# Patient Record
Sex: Male | Born: 1980 | Race: White | Hispanic: No | Marital: Married | Smoking: Current every day smoker
Health system: Southern US, Community
[De-identification: ages and names within clinical notes are randomized; demographics above are authoritative.]

## PROBLEM LIST (undated history)

## (undated) DIAGNOSIS — F419 Anxiety disorder, unspecified: Secondary | ICD-10-CM

## (undated) DIAGNOSIS — M5030 Other cervical disc degeneration, unspecified cervical region: Secondary | ICD-10-CM

## (undated) DIAGNOSIS — M5020 Other cervical disc displacement, unspecified cervical region: Secondary | ICD-10-CM

## (undated) HISTORY — PX: OTHER SURGICAL HISTORY: SHX169

## (undated) HISTORY — PX: APPENDECTOMY: SHX54

## (undated) HISTORY — PX: SHOULDER SURGERY: SHX246

---

## 2008-07-03 DIAGNOSIS — S42009A Fracture of unspecified part of unspecified clavicle, initial encounter for closed fracture: Secondary | ICD-10-CM | POA: Insufficient documentation

## 2008-07-27 DIAGNOSIS — R220 Localized swelling, mass and lump, head: Secondary | ICD-10-CM | POA: Insufficient documentation

## 2008-07-27 DIAGNOSIS — R221 Localized swelling, mass and lump, neck: Secondary | ICD-10-CM | POA: Insufficient documentation

## 2012-05-05 ENCOUNTER — Other Ambulatory Visit: Payer: Self-pay | Admitting: Primary Care

## 2012-05-05 DIAGNOSIS — Z0000 Encounter for general adult medical examination without abnormal findings: Secondary | ICD-10-CM

## 2012-05-06 ENCOUNTER — Encounter: Payer: Self-pay | Admitting: Primary Care

## 2012-06-20 ENCOUNTER — Encounter: Payer: Self-pay | Admitting: Neurology

## 2012-06-20 ENCOUNTER — Ambulatory Visit: Payer: Self-pay | Admitting: Neurology

## 2012-06-20 ENCOUNTER — Other Ambulatory Visit: Payer: Self-pay | Admitting: Primary Care

## 2012-06-20 VITALS — BP 128/82 | HR 76 | Ht 68.0 in | Wt 152.0 lb

## 2012-06-20 DIAGNOSIS — M25559 Pain in unspecified hip: Secondary | ICD-10-CM

## 2012-06-20 MED ORDER — NABUMETONE 750 MG PO TABS *I*
750.0000 mg | ORAL_TABLET | Freq: Two times a day (BID) | ORAL | Status: DC
Start: 2012-06-20 — End: 2012-12-15

## 2012-06-20 MED ORDER — HYDROCODONE-ACETAMINOPHEN 5-325 MG PO TABS *I*
ORAL_TABLET | ORAL | Status: DC
Start: 2012-06-20 — End: 2012-12-15

## 2012-06-21 ENCOUNTER — Telehealth: Payer: Self-pay | Admitting: Primary Care

## 2012-06-21 NOTE — Telephone Encounter (Signed)
Please call - hip xray showed no acute concern - very mild arthritis.  Has pain improved?  If not, orthopedic consult.

## 2012-06-21 NOTE — Telephone Encounter (Signed)
Left v/m for pt- will get him in tomorrow w/urgent care

## 2012-06-21 NOTE — Telephone Encounter (Signed)
Pt states is still in pain 7-8/10. Agrees to ortho ref

## 2012-06-21 NOTE — Telephone Encounter (Signed)
Any way we could get into ortho today or tomorrow?

## 2012-06-21 NOTE — Telephone Encounter (Signed)
Looking for xray results

## 2012-06-22 NOTE — Telephone Encounter (Signed)
L/m for pt to call me back Re: best time for ortho appt

## 2012-06-22 NOTE — Telephone Encounter (Signed)
Message left earlier in AM

## 2012-06-23 ENCOUNTER — Encounter: Payer: Self-pay | Admitting: Orthopedic Surgery

## 2012-06-23 ENCOUNTER — Ambulatory Visit: Payer: Self-pay | Admitting: Orthopedic Surgery

## 2012-06-23 VITALS — BP 132/101 | HR 112 | Ht 68.0 in | Wt 150.0 lb

## 2012-06-23 DIAGNOSIS — M25559 Pain in unspecified hip: Secondary | ICD-10-CM

## 2012-06-23 NOTE — Progress Notes (Signed)
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 He states he did not have a specific injury but did move his residents recently, a lot of lifting and carrying, he states he skin to work all day and upon further questioning he does frequently stand with his left hip thrust out. At any rate 1 week ago he began having pain and points directly along the area of the gluteus medius/minimus/tensor fossa lata muscle. He has pain with prolonged standing, with range of motion. No paresthesias or radicular symptoms.    PAST MEDICAL HISTORY: Clavicle fracture    PAST SURGICAL HISTORY: Clavicle reconstruction    MEDICATIONS:  See Medication List    ALLERGIES:  See Allergy List    PERSONAL HISTORY: Married    SOCIAL HISTORY: One half pack per day smoker, does drink alcohol    FAMILY HISTORY: Negative    REVIEW OF SYSTEMS:  Review of Gastointestinal, Genitourinary, Neurologic, Integument, Vascular, Hematologic, Lymphatic, Cardiac, Pulmonary and Endocrine systems reveal the following: The review of systems has been reviewed and is available on the scanned intake form. Positive responses are noted if pertinent.    PHYSICAL EXAM: Left hip shows no gross swelling or ecchymosis, he is tenderness along the gluteus medius and minimus muscle and particularly at the posterior attachment on the greater trochanter. Skin is intact, neurovascularly intact, full range of motion. He has localized pain with hip abduction against resistance.    IMAGING: X-rays are normal    ASSESSMENT AND DIAGNOSIS: Gluteus medius/minimus strain.    PLAN: Patient just recently set an anti-inflammatory which seems to be providing early benefit. He was given some stretching exercises, he is given referral to physical therapy in case she has ongoing symptoms, followup will be in 3 weeks for repeat physical exam.    ORDERS TODAY:    ORDERS PLACED FOR  NEXT VISIT:    PERCENT OF TEMPORARY IMPAIRMENT:      The above document was generated using voice recognition software. Reasonable attempts at correction were made. Please excuse any unintended transcription errors.

## 2012-06-23 NOTE — Telephone Encounter (Signed)
Pt scheduled 06/23/12

## 2012-07-04 NOTE — Progress Notes (Signed)
S:  1 week hx of left lateral hip pain.  Radiates from buttock to groin.  Worse with ROM and weight bearing.  Ambulating with limp.  Denies precipating injury, although recently relocated - has been lifting and carrying boxes.  Denies back pain, LE pain/paresethesia/weakness.  Has been taking Ibuprofen 600 mg several times daily.  Has not applied heat or ice.      Medication regime reconciled.  Tobacco use (+).    O:  Blood pressure 128/82, pulse 76, height 1.727 m (5\' 8" ), weight 68.947 kg (152 lb).  Appears uncomfortable - ambulating with limp.  Left buttock and hip unremarkable for ecchymosis, erythema, rash.  Exquisite tenderness over left buttock and greater trochanter.  ROM limited due to pain - most pronounced with abduction, external and internal rotation.  Neurovascularly intact.    Imp/Plan:  Left hip pain - rule out trochanter bursitis.  Nabumetone 750 mg bid with food.  Norco 5/325 as directed for acute pain.  Heat and topical analgesics.  Consider orthopedic consult pending symptom response.

## 2012-07-12 ENCOUNTER — Ambulatory Visit: Payer: Self-pay | Admitting: Orthopedic Surgery

## 2012-08-15 ENCOUNTER — Other Ambulatory Visit: Payer: Self-pay | Admitting: Primary Care

## 2012-08-15 DIAGNOSIS — Z0000 Encounter for general adult medical examination without abnormal findings: Secondary | ICD-10-CM

## 2012-08-16 ENCOUNTER — Encounter: Payer: Self-pay | Admitting: Primary Care

## 2012-12-14 ENCOUNTER — Other Ambulatory Visit: Payer: Self-pay | Admitting: Primary Care

## 2012-12-14 DIAGNOSIS — Z0000 Encounter for general adult medical examination without abnormal findings: Secondary | ICD-10-CM

## 2012-12-15 ENCOUNTER — Ambulatory Visit: Payer: Self-pay | Admitting: Primary Care

## 2012-12-15 ENCOUNTER — Encounter: Payer: Self-pay | Admitting: Primary Care

## 2012-12-15 VITALS — BP 120/82 | HR 64 | Ht 68.5 in | Wt 148.0 lb

## 2012-12-15 DIAGNOSIS — E7800 Pure hypercholesterolemia, unspecified: Secondary | ICD-10-CM

## 2012-12-15 DIAGNOSIS — Z0000 Encounter for general adult medical examination without abnormal findings: Secondary | ICD-10-CM

## 2012-12-15 DIAGNOSIS — Z23 Encounter for immunization: Secondary | ICD-10-CM

## 2012-12-15 NOTE — H&P (Signed)
History and Physical    HISTORY:  Chief Complaint   Patient presents with   . Annual Exam         History of Present Illness:    HPI Comments: Expecting baby-whooping cough vacc, chol, diet ok, allergies mild, hip issues-djd, bp ok      Problems:  Patient Active Problem List   Diagnosis Code   . Closed Fracture Of The Clavicle 810.00   . Head External Swelling (___ Cm) 784.2        Past Medical/Surgical History:   No past medical history on file.  No past surgical history on file.      Allergies:    Allergies   Allergen Reactions   . No Known Drug Allergy      Created by Conversion - 0;        Current medications:    Current Outpatient Prescriptions   Medication Sig   . loratadine (CLARITIN) 10 MG tablet Take 10 mg by mouth daily as needed for Allergies   . nabumetone (RELAFEN) 750 MG tablet Take 1 tablet (750 mg total) by mouth 2 times daily   . HYDROcodone-acetaminophen (NORCO) 5-325 MG per tablet Take 1-2 tablets q6hrs prn acute pain.  mdd 8/day.  5 day emergent cover.       Family History:    Family History   Problem Relation Age of Onset   . Conversion Other      20100511^Adenocarcinoma Of The Lung^162.9^Active^   . Conversion Other      16109604^VWUJWJXB Mellitus^250.00^Active^       Social/Occupational History:   History     Social History   . Marital Status: Married     Spouse Name: N/A     Number of Children: N/A   . Years of Education: N/A     Social History Main Topics   . Smoking status: Current Every Day Smoker -- 0.50 packs/day   . Smokeless tobacco: None   . Alcohol Use: None   . Drug Use: None   . Sexual Activity: None     Other Topics Concern   . None     Social History Narrative   . None         Review of Systems:    Review of Systems   Constitutional: Negative.    HENT: Negative.    Eyes: Negative.    Respiratory: Negative.    Cardiovascular: Negative.    Gastrointestinal: Negative.    Genitourinary: Negative.    Musculoskeletal: Negative.    Skin: Negative.    Neurological: Negative.     Endo/Heme/Allergies: Negative.        Vital Signs:   BP 120/82  Pulse 64  Ht 1.74 m (5' 8.5")  Wt 67.132 kg (148 lb)  BMI 22.17 kg/m2      PHYSICAL EXAM:  Physical Exam   Vitals reviewed.  Constitutional: He is oriented to person, place, and time. No distress.   HENT:   Mouth/Throat: No oropharyngeal exudate.   Eyes: No scleral icterus.   Cardiovascular: Normal rate and regular rhythm.    Pulmonary/Chest: Effort normal and breath sounds normal.   Abdominal: Soft. Bowel sounds are normal.   Musculoskeletal: He exhibits tenderness. He exhibits no edema.   Lymphadenopathy:     He has no cervical adenopathy.   Neurological: He is alert and oriented to person, place, and time. No cranial nerve deficit.   Skin: Skin is warm and dry.  Assessment:    Carlos Dixon was seen today for annual exam.    Diagnoses and associated orders for this visit:    Annual physical exam  - EKG 12 lead         .      Plan:      1.  Allergies: Continue OTC  2.  Cholesterol: Check baseline profile  3.  Hip pain: Likely soft tissue injury versus early DJD, OTC NSAIDs as needed  4.  Health maintenance: Updated

## 2014-02-06 ENCOUNTER — Ambulatory Visit: Payer: Self-pay | Admitting: Physical Medicine and Rehabilitation

## 2014-02-27 ENCOUNTER — Telehealth: Payer: Self-pay | Admitting: Primary Care

## 2014-02-27 NOTE — Telephone Encounter (Signed)
Pt's wife called has a painful cyst on his neck- size of a nickel.  Pt would like Thursday afternoon around 3 if possible

## 2014-03-01 ENCOUNTER — Ambulatory Visit: Payer: Self-pay | Admitting: Primary Care

## 2014-03-01 ENCOUNTER — Encounter: Payer: Self-pay | Admitting: Primary Care

## 2014-03-01 VITALS — BP 108/72 | HR 64 | Wt 147.8 lb

## 2014-03-01 DIAGNOSIS — M542 Cervicalgia: Secondary | ICD-10-CM

## 2014-03-01 DIAGNOSIS — L723 Sebaceous cyst: Secondary | ICD-10-CM

## 2014-03-01 DIAGNOSIS — L089 Local infection of the skin and subcutaneous tissue, unspecified: Secondary | ICD-10-CM

## 2014-03-01 MED ORDER — SULFAMETHOXAZOLE-TRIMETHOPRIM 800-160 MG PO TABS *I*
1.0000 | ORAL_TABLET | Freq: Two times a day (BID) | ORAL | Status: AC
Start: 2014-03-01 — End: 2014-03-08

## 2014-03-01 NOTE — Progress Notes (Signed)
Subjective:     Patient ID: Carlos Dixon is a 34 y.o. male.    HPI Comments: seb cyst on neck , no fever previous h/o problem      Carlos Dixon has Closed Fracture Of The Clavicle and Head External Swelling (___ Cm) on his problem list.  Carlos Dixon has a current medication list which includes the following prescription(s): loratadine.    Review of Systems          Objective:   Physical Exam   Constitutional: No distress.   Cardiovascular: Normal rate and regular rhythm.    Pulmonary/Chest: Effort normal and breath sounds normal.   Lymphadenopathy:     He has no cervical adenopathy.   Skin: Skin is warm and dry.   infecrted seb cyst post neck    Vitals reviewed.            Assessment:      1.  Sebaceous cyst: Infected, trial of antibiotics, if fails to resolve referral to dermatology for removal, see med sheet for details  2.  Allergies: Continue loratadine          Plan:      Followup as needed

## 2014-09-24 ENCOUNTER — Ambulatory Visit: Payer: Self-pay | Admitting: Primary Care

## 2014-10-05 ENCOUNTER — Other Ambulatory Visit: Payer: Self-pay

## 2014-10-05 ENCOUNTER — Encounter: Payer: Self-pay | Admitting: Gastroenterology

## 2014-10-05 LAB — COMPREHENSIVE METABOLIC PANEL
ALT: 24 U/L (ref 10–49)
AST: 24 U/L (ref 7–37)
Albumin: 4.9 g/dL — ABNORMAL HIGH (ref 3.2–4.8)
Alk Phos: 51 U/L (ref 46–116)
Anion Gap: 5 mEq/L (ref 4–16)
Bilirubin,Total: 0.8 mg/dL (ref 0.3–1.2)
CO2: 29 mEq/L (ref 20–31)
Calcium: 10.5 mg/dL — ABNORMAL HIGH (ref 8.5–10.4)
Chloride: 105 mEq/L (ref 98–108)
Creatinine: 0.8 mg/dL (ref 0.7–1.2)
Globulin: 2.4 g/dL (ref 2.4–4.3)
Glucose: 109 mg/dL — ABNORMAL HIGH (ref 65–100)
Lab: 25 mg/dL — ABNORMAL HIGH (ref 8–20)
Potassium: 5 mEq/L (ref 3.5–5.1)
Sodium: 139 mEq/L (ref 135–145)
Total Protein: 7.3 g/dL (ref 6.4–8.5)

## 2014-10-05 LAB — CBC AND DIFFERENTIAL
Baso # K/uL: 0 10*3/uL (ref 0.0–0.2)
Basophil %: 0 % (ref 0–3)
Eos # K/uL: 0 10*3/uL (ref 0.0–0.6)
Eosinophil %: 0 % (ref 0–5)
Hematocrit: 45 % (ref 40–52)
Hemoglobin: 15.8 g/dL (ref 13.0–18.0)
Lymph # K/uL: 1.4 10*3/uL (ref 1.0–4.8)
Lymphocyte %: 6 % — ABNORMAL LOW (ref 15–45)
MCH: 30.5 pg (ref 26.0–34.0)
MCHC: 34.9 g/dL (ref 32.0–37.5)
MCV: 88 fL (ref 80–100)
Mono # K/uL: 1.5 10*3/uL — ABNORMAL HIGH (ref 0.1–1.0)
Monocyte %: 6 % (ref 0–15)
Neut # K/uL: 21.6 10*3/uL — ABNORMAL HIGH (ref 1.8–8.0)
Platelets: 196 10*3/uL (ref 150–450)
RBC: 5.18 10*6/uL (ref 4.40–6.20)
RDW: 12.9 % (ref 0.0–15.2)
Seg Neut %: 88 % — ABNORMAL HIGH (ref 45–75)
WBC: 24.6 10*3/uL — ABNORMAL HIGH (ref 4.0–11.0)

## 2014-10-05 LAB — PROTIME-INR
INR: 1 (ref 0.8–1.1)
Protime: 10.3 s (ref 9.0–12.0)

## 2014-10-05 LAB — URINALYSIS REFLEX TO CULTURE
Blood,UA: NEGATIVE
Glucose, Ur: NEGATIVE mg/dL
Ketones, UA: 40 mg/dL — AB
Leuk Esterase,UA: NEGATIVE
Nitrite,UA: NEGATIVE
PH,Ur: 6 (ref 5.0–8.0)
Protein,UA: 30 mg/dL — AB
Specific Gravity,UA: >1.035 — AB (ref 1.005–1.030)

## 2014-10-05 LAB — LIPASE: Lipase: 19 U/L (ref 6–51)

## 2014-10-05 LAB — ESTIMATED GFR
GFR,Black: >59 mL/min
GFR,Caucasian: >59 mL/min

## 2014-10-05 LAB — DIFF MANUAL

## 2014-10-05 LAB — APTT: aPTT: 24.9 s (ref 22.0–34.0)

## 2014-10-12 LAB — SURGICAL PATHOLOGY

## 2015-07-04 ENCOUNTER — Ambulatory Visit: Payer: Self-pay | Admitting: Primary Care

## 2015-07-08 ENCOUNTER — Ambulatory Visit: Payer: Self-pay | Admitting: Neurology

## 2015-07-30 ENCOUNTER — Telehealth: Payer: Self-pay | Admitting: Otolaryngology

## 2015-07-30 ENCOUNTER — Ambulatory Visit: Payer: Self-pay | Admitting: Primary Care

## 2015-07-30 ENCOUNTER — Encounter: Payer: Self-pay | Admitting: Primary Care

## 2015-07-30 VITALS — BP 122/70 | HR 83 | Wt 155.6 lb

## 2015-07-30 DIAGNOSIS — IMO0002 Reserved for concepts with insufficient information to code with codable children: Secondary | ICD-10-CM

## 2015-07-30 DIAGNOSIS — Z9049 Acquired absence of other specified parts of digestive tract: Secondary | ICD-10-CM

## 2015-07-30 NOTE — Progress Notes (Signed)
Subjective:     Patient ID: Carlos Dixon is a 35 y.o. male.    HPI Comments: New lump in post neck non tender no fever no redness,       Louise has Closed Fracture Of The Clavicle and Head External Swelling (___ Cm) on his problem list.  Madelaine Bhatdam has a current medication list which includes the following prescription(s): loratadine.    Review of Systems        Objective:   Physical Exam   Skin: Skin is warm and dry. No rash noted.   1 cm mobile non tender cyst of neck   Vitals reviewed.          Assessment:      1.  Probable sebaceous cyst of neck: not a lymph node, referral for removal, call if symptoms change or worsen  2.  Allergies: Continue OTC       Plan:      Follow-up as needed

## 2015-07-30 NOTE — Telephone Encounter (Signed)
Dr [place]'s office called. Patient was seen by Dr Roanna Raideroerr in 2010 for a right sided neck mass. He saw Dr Aron BabaWilmot today because the mass reappeared. Dr Aron BabaWilmot diagnosed it as a sebaceous cyst. He is wondering if Dr Roanna Raideroerr would be willing to see the patient again or if he should refer him to derm. Please advise

## 2015-07-31 NOTE — Telephone Encounter (Signed)
Contacted Herbert SetaHeather (wife) [phone]. I offered an appointment on offered 6/9@7 :45am w/ Dr. Roanna Raideroerr. She stated that will not work for them. She will check with her husband and will call us back to schedule.

## 2015-07-31 NOTE — Telephone Encounter (Signed)
Tried calling patient to schedule, no answer. Unable to leave a message-voicemail box full.

## 2015-11-26 ENCOUNTER — Ambulatory Visit: Payer: Self-pay | Admitting: Orthopedic Surgery

## 2015-11-26 ENCOUNTER — Encounter: Payer: Self-pay | Admitting: Orthopedic Surgery

## 2015-11-26 VITALS — BP 135/90 | HR 84 | Ht 69.0 in | Wt 150.0 lb

## 2015-11-26 DIAGNOSIS — M7541 Impingement syndrome of right shoulder: Secondary | ICD-10-CM | POA: Insufficient documentation

## 2015-11-26 DIAGNOSIS — G2589 Other specified extrapyramidal and movement disorders: Secondary | ICD-10-CM

## 2015-11-26 DIAGNOSIS — M25511 Pain in right shoulder: Secondary | ICD-10-CM

## 2015-11-26 DIAGNOSIS — M7521 Bicipital tendinitis, right shoulder: Secondary | ICD-10-CM

## 2015-11-26 HISTORY — DX: Other specified extrapyramidal and movement disorders: G25.89

## 2015-11-26 HISTORY — DX: Bicipital tendinitis, right shoulder: M75.21

## 2015-11-26 NOTE — Progress Notes (Signed)
History:  Carlos Dixon is a 35 y.o. that is being seen as a consult request from Dr. Aron Baba, Francis Gaines, MD for evaluation of his Right shoulder pain.  He does not remember any particular injury.  The symptoms first occured on 1.5 to 2 years.  The location of the pain is lateral.  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 Pulling is an aggravating motion.  Alleviating factors include:  Rest.    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 He has difficulty with reaching into the bag. He feels it every day but rest does help. No injections. No formal physical therapy. Never seen for the right shoulder. No previous trauma to this shoulder or dislocation. He does feel quite limited at work because of this right shoulder pain.    Past medical history, past surgical history, medications, allergies, family history, social history, and review of systems were reviewed today and have been documented separately in this encounter.      Past Medical History:   Diagnosis Date    Scapular dyskinesis 11/26/2015    Tendonitis of upper biceps tendon of right shoulder 11/26/2015    Right hip arthritis, previous fracture left clavicle    History reviewed. No pertinent surgical history.left clavicle ORIF, appendectomy    Social History     Social History    Marital status: Married     Spouse name: N/A    Number of children: N/A    Years of education: N/A     Occupational History    Not on file.     Social History Main Topics    Smoking status: Current Every Day Smoker     Packs/day: 0.50    Smokeless tobacco: Not on file    Alcohol use Not on file    Drug use: Not on file    Sexual activity: Not on file     Social History Narrative     Works as a mail delivery    Family History   Problem Relation Age of Onset    Conversion Other      20100511^Adenocarcinoma Of The Lung^162.9^Active^    Conversion Other      16109604^VWUJWJXB Mellitus^250.00^Active^           Current Outpatient Prescriptions:     cholecalciferol (VITAMIN D) 1000 UNIT tablet, Take 1,000 Units by mouth daily, Disp: , Rfl:     Misc Natural Products (GLUCOSAMINE CHOND COMPLEX/MSM PO), Take by mouth, Disp: , Rfl:     vitamins A-D-E-K (AQUADEKS) chewable tablet, Place 1 tablet into mouth, chew and swallow daily, Disp: , Rfl:     ibuprofen (ADVIL) 200 MG tablet, Take 200 mg by mouth every 6 hours as needed for Pain, Disp: , Rfl:     loratadine (CLARITIN) 10 MG tablet, Take 10 mg by mouth daily as needed for Allergies, Disp: , Rfl:     Allergies   Allergen Reactions    No Known Drug Allergy      Created by Conversion - 0;        I have reviewed, confirmed, and made changes as appropriate.  Details include:    Review of Systems      Constitutional    [-] Fever / Chills    Skin    [-] Rash    HENT    [-] Headaches    Cardiovascular    [-] Chest pain    Gastrointestinal    [-]  Nausea / Vomiting    Musculoskeletal    [+] Myalgias / Joint pain    Neurological    [-] Tingling    Edited byScotty Court:    Tamorah Hada Tue Nov 26, 2015  9:36 AM         Physical Examination:    He is in no acute distress.  He is alert and oriented x 3.   Examination of his neck demonstrates normal cervical range of motion with nomidline and no paraspinal tenderness.    The right shoulder demonstrates forward elevation to 160 degrees. He externally rotates to 45 degrees. He internally rotates to Lumbar. He has grade 5/5 supraspinatus strength with pain.  He has grade 5/5 infraspinatus strength and grade 5/5 subscapularis strength w pain.  Hawkins test was positive.  Neer test was positive.  POSITIVE SPEED's. O'Brian test was positive.  Crank test was negative.  Apprehension test was negative.  Relocation test was negative.  There is not tenderness at the [facility] joint. There is tenderness along the biceps. Distally he is neurovascularly intact. There is scapular dyskinesia.  There is scapular winging--dynamic.    Imaging: I personally  reviewed the patients images. X-Rays demonstrate AP, grashey, axillary and scapular y 11/26/15 views of RIGHT shoulder without fracture or lesion. Adequate joint space. No osteophytic changes. Located glenohumeral joint. Some mild arthrosis of the AC joint.           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  The patient would like to proceed with a course of physical therapy and NSAIDS.  We will see them back in in 6 weeks to evaluate.    The patient presents to clinic with shoulder pain, dysfunction, and weakness--consistent with scapular dykinesis, impingement syndrome, possible partial rotator cuff fraying, and proximal biceps tendonitis. The patient had x-rays taken today in clinic which DID NOT demonstrate significant arthritis and NO joint mal-alignment when I personally reviewed and interpreted the films. The patient has had symptoms for a duration of 12-18 months.    We discussed options regarding treatment for the shoulder. There is a wide range of treatment options:    Conservative management consists of physical therapy and/or injection therapy, typically with cortisone. There is also activity modification and living with the current pain and dysfunction. The patient was offered cortisone today but did decline, we can consider this treatment in the future if he would like, after he starts PT and uses NSAIDs.    We can also obtain an MRI in order to better evaluate the soft tissues, joint space, rotator cuff, sub-acromial space, and other shoulder structures. We can review these MRI findings either over the phone or the patient can come in for an MRI review clinic appointment where we go over the images  together. The patient will obtain an MRI if he has changes to his symptoms, worsening, or does not improve with initial conservative treatment.    We do not feel that he will be a surgical candidate at this time but we did discuss some general aspects of arthroscopic shoulder surgery including:  Arthroscopic shoulder surgery is an option to address some forms of shoulder pain. Through small poke holes/key-holes' or portals, we can diagnose pathology within the shoulder joint utilizing a special high-definition camera. We can then treat many of the conditions in the shoulder with specialized instruments, such as shavers and radio-frequency devices, to address inflamed tissue, frayed tissue, degenerated tissue, cartilage flaps, inflamed bursa, bone spurs, and we can additionally repair torn tendons and torn labrum. Typically, if the rotator cuff (specifically the supraspinatus or subscapularis) is torn, the long head of the biceps tendon is additionally torn, frayed, inflamed, and associated with a labral tear--known as a SLAP tear. We can address this torn biceps tendon, after careful intraoperative assessment, with a tenodesis, where we make a small open incision, remove the diseased part of the tendon, and reattach the tendon to bone.    Follow up in 6 weeks for repeat clinical evaluation, possible injection, possible MRI at that time.    Orders Placed This Encounter   Procedures    *Shoulder RIGHT standard AP, Grashey, and Lateral views    AMB REFERRAL TO PHYSICAL/ OCCUPATIONAL THERAPY     Scotty Court, MD  11/26/15

## 2015-11-26 NOTE — Patient Instructions (Signed)
Bursitis    The Basics   Written by the doctors and editors at UpToDate   What is bursitis?--Bursitis is a condition that can cause pain or swelling next to a joint. Most of the time, bursitis happens around the shoulder, elbow, hip, or knee. It can also happen around other joints in the body.  A bursa is a small fluid-filled sac that sits near a bone. It cushions and protects nearby tissues when they rub on or slide over bones. These sacs, called bursae, are found in many places throughout the body (figure 1 and figure 2). Bursitis happens when a bursa gets irritated and swollen. This can happen when a person:  ?Moves a joint over and over again in the same way, over a short period of time  ?Sits on a hard surface or stays in a position that presses on the bursa for a long time  ?Has certain kinds of arthritis, such as gout or rheumatoid arthritis, that can affect their joints and bursae  ?Gets hurt near a bursa  ?Has an infection that spreads to a bursa  What are the symptoms of bursitis?--Symptoms of bursitis can include:  ?Pain or tenderness  ?Swelling  ?Trouble moving the joint  A bursa can get infected if a person gets a cut on the skin nearby. An infected bursa can cause a fever and the area around the bursa to be:  ?Red  ?Swollen  ?Warm  ?Painful  If you have any of the symptoms of an infected bursa, let your doctor or nurse know as soon as possible.  Is there a test for bursitis?--Yes. Your doctor or nurse will ask about your symptoms and do an exam.   If you have symptoms of an infected bursa, your doctor might use a needle to remove some fluid from the bursa. Then he or she can do lab tests on the fluid to find out what is causing the bursitis, and if you need antibiotics.  He or she might also order imaging tests, such as an MRI scan or ultrasound. Imaging tests can create pictures of the inside of the body.  What can I do to treat my bursitis?--To treat your bursitis, you  can:  ?Rest, cushion, and protect the area - Try not to irritate the area that hurts. For example, people with very painful shoulder bursitis might need to avoid lifting or carrying heavy things for a while. They might also need to wear an arm sling. People with bursitis behind the heel might need to use a thick heel pad. This can raise the heel so that it does not rub against the back of the shoe.  ?Avoid positions that put pressure on the area - For example, people with bursitis in the front of the knee should avoid kneeling.  ?Put ice on the area to reduce pain - Use a frozen bag of peas or a cold gel pack a few times a day for 20 minutes each time.  ?Put heat on the area to reduce pain and stiffness - Do not use heat for more than 20 minutes at a time. Also, do not use anything too hot that could burn your skin.  What other treatments might I have?--Your doctor or nurse might use other treatments, depending on your symptoms and where your bursitis is. Treatments can include:  ?Pain-relieving medicines called nonsteroidal anti-inflammatory drugs or NSAIDs - NSAIDs include ibuprofen (sample brand names: Advil, Motrin), and naproxen (sample brand name: Aleve). These medicines can  reduce pain and prevent the bursae from getting swollen and painful.  ?Steroid injections - Steroid medicines help reduce inflammation. These medicines are different from the steroids athletes use to build muscle. Doctors can inject steroids into the area of the bursitis to help reduce symptoms.   ?Exercises and stretches - Your doctor or nurse might recommend that you work with a physical therapist. A physical therapist can teach you stretches and exercises to help reduce your symptoms.  ?Surgery - A doctor can do surgery if other treatments do not work and you have had symptoms for a long time.  People with an infected bursa might also have treatment that includes:  ?Antibiotics  ?Having the fluid in the bursa drained - A doctor  can drain the fluid using a needle and syringe, or by doing surgery.  Can bursitis be prevented?--Yes. To help reduce the chance that you get bursitis, you can:  ?Use cushions or pads to avoid putting too much pressure on joints - For example, people who garden can kneel on a kneeling pad. People who sit for a long time can sit on a cushioned chair.  ?Take breaks, if you are using a certain joint too much  ?Stop an activity or change the way you are doing it, if you feel pain  ?Exercise  ?Lose weight, if you are overweight   ?Use good posture  All topics are updated as new evidence becomes available and our peer review process is complete.  This topic retrieved from Up[employer] on: Jun 14, 2013.  Topic 15727 Version 5.0  Release: 23.3 - C23.74  2015[employer], Inc.All rights reserved.  figure 1: Bursae near the hip     These sacs, called "bursae," are filled with fluid. They help cushion and protect the joints. "Bursitis" is the medical term for when one of these sacs gets irritated or inflamed.  Graphic [id number] Version 7.0  figure 2: Knee bursa (prepatellar bursa)     Graphic [id number] Version 3.0  Consumer Information Use and Disclaimer   This information is not specific medical advice and does not replace information you receive from your health care provider. This is only a brief summary of general information. It does NOT include all information about conditions, illnesses, injuries, tests, procedures, treatments, therapies, discharge instructions or life-style choices that may apply to you. You must talk with your health care provider for complete information about your health and treatment options. This information should not be used to decide whether or not to accept your health care provider's advice, instructions or recommendations. Only your health care provider has the knowledge and training to provide advice that is right for you.The use of UpToDate content is governed by the UpToDate Terms of Use. 2015  [employer]Date, Inc. All rights reserved.  Copyright   2015[employer]Date, Inc.All rights reserved.      REHABILITATION PROTOCOL:  SCAPULA DYSKINESIS  Scotty CourtSandeep Danya Spearman, MD, PhD  PHASE 1:  ACUTE PHASE (WEEKS 0 - 3)      Initially, avoid painful arm movement and establish scapular motion.     Begin soft tissue mobilization, electrical modalities, ultrasound, and assisted stretching.    Begin upper extremity weight shifts, wobble board exercises, scapular clock, rhythmic ball stabilization and weight-bearing isometric extension.    Use these CKC exercises in various planes and levels of elevation, but coordinate them with appropriate scapular positioning.    Initiate scapular motion exercises without arm elevation.    Include arm motion with scapular motion exercises because the scapular  motion improves to reestablish scapulohumeral coupling patterns.  Keep the arm close to the body initially to minimize the intrinsic load.    Emphasize lower abdominal and hip extensor exercises form the standing position.       PHASE 2:  RECOVERY PHASE (WEEKS 3 - 8)      Continue to emphasize lower abdominal and hip extensor exercises along with flexibility exercises for scapular stabilizers.    Increase the loads on CKC exercises such as wall push-ups, table push-ups, and modified prone push-ups.    Also, increase the level of arm elevation in CKC exercises as scapular control improves.    Add arm elevation and rotation patterns to scapular motion exercises, as able.  Use diagonal patterns, scapular plane, and flexion.  Progress toward active abduction.    Begin tubing exercises using hip and trunk extension with retraction and hip and trunk flexion with protraction.    As scapulohumeral coupling and control are achieved, dumbbell punches may be introduced.      Use lunges with dumbbell reaches to emphasize kinetic chain timing and coordination.      PHASE 3:  FUNCTIONAL PHASE (WEEKS  6 - 10)      When there is good scapular  control and motion throughout the range of shoulder elevation, initiate plyometric exercise such as medicine ball toss and catch and tubing plyometrics.    Continue to include kinetic chain activation.  Move to various planes as scapular control improves.    Slow, resisted sport-skill movements, such as the throwing motion, are good activities to promote kinetic chain stabilization while dynamically loading the scapular muscles.    Overhead dumbbell presses and punches, in various planes, are advanced exercises requiring good scapular control through a full and loaded GH joint ROM.    The lunge-and-reach series can be progressed to overhead reaches in the return position.    Progressively add external resistance to exercises introduced earlier in the program.  The volume of work becomes a progression as do the difficulty of the exercise and the amount of resistance.    Challenging lower extremity stability using wobble board, trampoline, slide boards, and the like also increases the load on the scapular musculature without sacrificing the functional movements.

## 2015-12-02 ENCOUNTER — Encounter: Payer: Self-pay | Admitting: Physical Therapy

## 2015-12-02 NOTE — Progress Notes (Signed)
A user error has taken place: encounter opened in error, closed for administrative reasons.

## 2015-12-20 ENCOUNTER — Ambulatory Visit: Payer: Self-pay | Admitting: Rehabilitative and Restorative Service Providers"

## 2015-12-20 ENCOUNTER — Ambulatory Visit: Payer: Self-pay | Admitting: Primary Care

## 2015-12-20 DIAGNOSIS — M25811 Other specified joint disorders, right shoulder: Secondary | ICD-10-CM

## 2015-12-20 DIAGNOSIS — M7541 Impingement syndrome of right shoulder: Secondary | ICD-10-CM

## 2015-12-20 NOTE — Progress Notes (Signed)
PT SPORTS REHABILITATION UE EVALUATION    History  Diagnosis: right shoulder impingement   Onset date:  1.5-2 years ago  Date of surgery: NA    Carlos Dixon is a 35 y.o. male who is present today for right, shoulder care.  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   Stresses/physical demands of home: Self Care, Housekeeping, Gardening/Yard Work and Child Care  Sport(s): NA , walking for work   Diagnostic tests: Per report, reviewed    Other: NA    Symptom location: Lateral and Anterior, right  Relevant symptoms: Aching, Pain   Symptom frequency: Intermittent  Symptom intensity (0 - 10 scale): Now 2 Best 0 Worst 10   Night Pain: Yes      Symptoms worsen with: repetitive movements, reaching behind back and overhead   Symptoms improve with: Ibuprofen   Assistive device:  none  Patients goals for therapy: Reduce pain, Independent with home program, return to work, return to exercising      Objective:    Observation: WNL  Palpation: tenderness @ joint  Cervical Screen:  WNL  Neurologic:  NA        ROM/Strength    UE      AROM AROM PROM PROM MMT MMT    Right Left Right Left Right Left   Shoulder          Forward Flexion 140 145   4- 5   Abduction  140 170   4 5   Internal Rotation 30 45   4+ 5   External Rotation 45 85   4- 5                Special Tests:    Shoulder Jobe, positive  Neer,  positive  Leanord AsalKennedy Hawkins,  positive  ER lag sign,  negative  Subscapularis lift off,  negative  Speed's,  negative  O'Brien,  positive     (+) scapular dyskinesis with repeated elevation of arm     Elbow NA   Wrist/hand NA      Functional:  Perform self-care activities/basic ADLs - able to perform.  Perform functional forward reach - able to perform with pain  Reach overhead - able to perform with pain and limited    Assessment:   Findings consistent with  35 y.o. male with right shoulder impingement  with pain    Prognosis:  Good    Contraindications/Precautions/Limitation:  Per diagnosis    Short Term Goals: (2 week(s)): Decrease c/o max pain to < 4/10 and Minimal assistance with HEP/ education concepts  Long Term Goals: (3 month(s)): Pain/Sx 0 - minimal, ROM/ flexibility WNL , Restoration of functional strength, Independent with HEP/education , Functional return to ADLs / activities without limitations     Treatment Plan:   Options / plan reviewed with patient:  Yes  Freq 1 times per week for 3 month(s)    Treatment plan inclusive of:   Exercise: AROM, AAROM, PROM, Stretching, Progressive Resistive, Aerobic exercise   Manual Techniques:  Joint mobilization   Modalities:  Cold pack, Functional/Therapeutic activites per flowsheet, Ther Exercise per flowsheet   Functional: Proprioception/Dynamic stability, Functional rehab      [facility] you [facility] the [facility] of this [facility].    Jani GravelAmanda Karlsson, PT    Wand ER at  45 and 90 abd  X 10    Sleeper stretch 10 x 10"   SL ER  X 10    Prone I  X 10    Prone T  X 10

## 2015-12-20 NOTE — Progress Notes (Signed)
SPORTS PT CHARGE:   Evaluation:  PT CODE 7162 - CPT CODE PT - 97162 - PT EVAL MOD COMPLX (x 30 min)  Therapeutic Exercises:  PT Code 314 - CPT Code PT - 97110 - Therapeutic Exercise (8-22 min) x 1 unit  Total Minute Time:  45 min

## 2015-12-30 ENCOUNTER — Ambulatory Visit: Payer: Self-pay | Admitting: Rehabilitative and Restorative Service Providers"

## 2015-12-30 DIAGNOSIS — M7541 Impingement syndrome of right shoulder: Secondary | ICD-10-CM

## 2015-12-30 DIAGNOSIS — M25811 Other specified joint disorders, right shoulder: Secondary | ICD-10-CM

## 2015-12-30 NOTE — Progress Notes (Signed)
SPORTS PT CHARGE:   Therapeutic Exercises:  PT Code 314 - CPT Code PT - 97110 - Therapeutic Exercise (38 - 52 min) x 3 units  Total Minute Time:  45 min

## 2015-12-30 NOTE — Progress Notes (Signed)
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 He reports fair compliance with HEP, has been able to complete about 4 days per week but not everyday due to time constraints.      Objective:  ROM - Improved - right shoulder   Flex = 147  Abd = 150  ER at 90 abd = 55   Strength - Ther Ex per flowsheet  Function: - Improved  Education:  Updated HEP, Verbal cues for ther ex, Manual cues for ther ex    Objective     Wand ER at 45 and 90 abd  X 10    Sleeper stretch 10 x 10"   SL ER  3 X 10    Prone I  3 X 10    Prone T  3 X 10        Tubing row  Orange 3 x 10                    Treatment:  Ther Exercise per flowsheet    Assessment:   Patient tolerated program well. Cues required to correct exercises. He does demonstrate improved ROM today.        Plan of Care:  Continue per Plan of care -  As written    Thank you [facility] this patient to [facility]    Jani GravelAmanda Karlsson, PT

## 2016-01-07 ENCOUNTER — Ambulatory Visit: Payer: Self-pay | Admitting: Rehabilitative and Restorative Service Providers"

## 2016-01-07 ENCOUNTER — Ambulatory Visit: Payer: Self-pay | Admitting: Orthopedic Surgery

## 2016-01-07 DIAGNOSIS — M7541 Impingement syndrome of right shoulder: Secondary | ICD-10-CM

## 2016-01-07 DIAGNOSIS — M25811 Other specified joint disorders, right shoulder: Secondary | ICD-10-CM

## 2016-01-07 NOTE — Progress Notes (Signed)
[facility]  PT Note    Carlos Dixon   [id number]     Diagnosis: right shoulder scapular dyskinesis, impingement     Subjective:  Pain Score:  2  Patient reports doing pretty well.   States he has not been able to do as many exercises this week due to having a newborn baby and staying at the hospital for 5 days.           Objective:  ROM - Improved - right shoulder   Flex = 147  Abd = 150  ER at 90 abd = 55   Strength - Ther Ex per flowsheet  Function: - Improved  Education:  Updated HEP, Verbal cues for ther ex, Manual cues for ther ex    Objective     Wand ER at 45 and 90 abd  X 10    Sleeper stretch 10 x 10"   SL ER  3 X 10 - 1#   Prone I  3 X 10 - 2#   Prone T  3 X 10 - 2#        Tubing row  Orange 3 x 10    Tubing ext  Orange 3 x 10                Treatment:  Ther Exercise per flowsheet    Assessment:   Patient tolerated program well.    Fatigued at end of session.         Plan of Care:  Continue per Plan of care -  As written    Thank you for [facility] this [facility] to [facility]    Jani GravelAmanda Karlsson, PT

## 2016-01-07 NOTE — Progress Notes (Signed)
SPORTS PT CHARGE:   Therapeutic Exercises:  PT Code 314 - CPT Code PT - 97110 - Therapeutic Exercise (38 - 52 min) x 3 units  Total Minute Time:  45 min

## 2016-01-15 ENCOUNTER — Ambulatory Visit: Payer: Self-pay

## 2016-01-15 DIAGNOSIS — M25811 Other specified joint disorders, right shoulder: Secondary | ICD-10-CM

## 2016-01-15 DIAGNOSIS — M7541 Impingement syndrome of right shoulder: Secondary | ICD-10-CM

## 2016-01-15 NOTE — Progress Notes (Signed)
SPORTS PT CHARGE:   Therapeutic Exercises:  PT Code 314 - CPT Code PT - 97110 - Therapeutic Exercise (38 - 52 min) x 3 units  Total Minute Time:  45 min

## 2016-01-15 NOTE — Progress Notes (Signed)
[facility]  PT Note    Carlos Dixon   [id number]     Diagnosis: right shoulder scapular dyskinesis, impingement     Subjective:  Pain Score:  2  Patient reports doing pretty well. He states he is has not had time to do his HEP as much as he would like because he has been in and out of the hospital with his newborn baby.            Objective:  ROM - Improved - right shoulder   Flex = 150  Abd = 150  ER at 90 abd = 60  Strength - Ther Ex per flowsheet  Function: - Improved  Education:  Updated HEP, Verbal cues for ther ex, Manual cues for ther ex    Objective     Wand ER at 45 and 90 abd  X 10    Sleeper stretch 10 x 10"   SL ER  3 X 10 - 1#   Prone I  3 X 10 - 2#   Prone T  3 X 10 - 2#        Tubing row  Orange 3 x 10    Tubing ext  Orange 3 x 10    Tubing IR/ER Walkouts 20x Each            Treatment:  Ther Exercise per flowsheet    Assessment:   Patient tolerated program well.    Fatigued at end of session.         Plan of Care:  Continue per Plan of care -  As written    [facility] this [facility]    Barth KirksAlison Massaro, ATC,PTA

## 2016-01-20 ENCOUNTER — Encounter: Payer: Self-pay | Admitting: Primary Care

## 2016-01-20 ENCOUNTER — Ambulatory Visit: Payer: Self-pay | Admitting: Rehabilitative and Restorative Service Providers"

## 2016-01-20 ENCOUNTER — Ambulatory Visit: Payer: Self-pay | Admitting: Primary Care

## 2016-01-20 VITALS — BP 110/84 | HR 66 | Wt 154.2 lb

## 2016-01-20 DIAGNOSIS — R454 Irritability and anger: Secondary | ICD-10-CM

## 2016-01-20 DIAGNOSIS — M7541 Impingement syndrome of right shoulder: Secondary | ICD-10-CM

## 2016-01-20 DIAGNOSIS — E7800 Pure hypercholesterolemia, unspecified: Secondary | ICD-10-CM

## 2016-01-20 DIAGNOSIS — M25811 Other specified joint disorders, right shoulder: Secondary | ICD-10-CM

## 2016-01-20 DIAGNOSIS — Z720 Tobacco use: Secondary | ICD-10-CM

## 2016-01-20 MED ORDER — VARENICLINE TARTRATE 0.5 MG X 11 & 1 MG X 42 PO MISC *A*
ORAL | 0 refills | Status: DC
Start: 2016-01-20 — End: 2017-01-19

## 2016-01-20 NOTE — Progress Notes (Signed)
Subjective:     Patient ID: Carlos Dixon is a 35 y.o. male.    HPI Comments: Tobacco  fhx lung ca, 1ppd, failed welbutrin,       Carlos Dixon has Closed Fracture Of The Clavicle; Head External Swelling (___ Cm); S/P appy; Tendonitis of upper biceps tendon of right shoulder; Impingement syndrome of shoulder region, right; and Scapular dyskinesis--right on his problem list.  Carlos Dixon has a current medication list which includes the following prescription(s): cholecalciferol, misc natural products, vitamins a-d-e-k, ibuprofen, and loratadine.    Review of Systems        Objective:   Physical Exam   Constitutional: No distress.   Cardiovascular: Normal rate and regular rhythm.    Pulmonary/Chest: Effort normal and breath sounds normal.   Musculoskeletal: He exhibits no edema.   Vitals reviewed.          Assessment:      1.  Tobacco abuse: Discussed multiple options, will try trial of chantix, consider addition of low-dose Wellbutrin if irritability worse  2.  Cholesterol: Has never been checked, recheck fasting profile, low threshold for statin given tobacco history       Plan:      Follow-up based on success or failure

## 2016-01-20 NOTE — Progress Notes (Signed)
SPORTS PT CHARGE:   Therapeutic Exercises:  PT Code 314 - CPT Code PT - 97110 - Therapeutic Exercise (38 - 52 min) x 3 units  Total Minute Time:  45 min

## 2016-01-20 NOTE — Progress Notes (Signed)
[facility]  PT Note    Carlos Dixon   [id number]     Diagnosis: right shoulder scapular dyskinesis, impingement     Subjective:  Pain Score:  2  Patient reports shoulder is feeling pretty good, has been okay since he has still been off of work since wife had their baby.  He goes back to work in one week.  He follows up with Dr. Stephenie AcresMannava tomorrow.           Objective:  ROM - Improved - right shoulder   Flex = 150  Abd = 150  ER at 90 abd = 85  IR at 90 abd = 25  Strength - Ther Ex per flowsheet  Function: - Improved  Education:  Updated HEP, Verbal cues for ther ex, Manual cues for ther ex    Objective     Wand ER at 45 and 90 abd     Sleeper stretch 10 x 10"           SL ER  3 X 10 - 1#   Prone I  3 X 10 - 2#   Prone T  3 X 10 - 2#        Tubing row  Orange 3 x 10    Tubing ext  Orange 3 x 10    Tubing IR/ER Walkouts 20x Each            Treatment:  Ther Exercise per flowsheet    Assessment:   Patient tolerated program well. He demonstrates improved ROM of shoulder and has improved scapular control.  He still fatigues by end of program.         Plan of Care:  Continue per Plan of care -  As written    [facility]    Jani GravelAmanda Karlsson, PT

## 2016-01-21 ENCOUNTER — Ambulatory Visit: Payer: Self-pay | Admitting: Orthopedic Surgery

## 2016-01-31 ENCOUNTER — Ambulatory Visit: Payer: Self-pay | Admitting: Rehabilitative and Restorative Service Providers"

## 2016-03-05 ENCOUNTER — Encounter: Payer: Self-pay | Admitting: Rehabilitative and Restorative Service Providers"

## 2016-03-05 DIAGNOSIS — M7541 Impingement syndrome of right shoulder: Secondary | ICD-10-CM

## 2016-03-05 DIAGNOSIS — M25811 Other specified joint disorders, right shoulder: Secondary | ICD-10-CM

## 2016-03-05 NOTE — Progress Notes (Signed)
Sports and Spine Rehabilitation  Discharge Summary      Carlos Dixon  [id number]  03/05/2016    DiagLane Hackernosis: right shoulder impingement, scapular dyskinesis    SUMMARY OF TREATMENTS:  Received care for 5 rehabilitation visits    Attendance:  Fair    Compliance:  Good     The treatment(s) included:  Home program instruction, Therapeutic exercise (ROM/flexibility/strength), Progressive Resistive Exercises, Functional progression, Therapeutic activities dynamic stabilization    Treatment Goals:  Unknown  Range of Motion/Flexibility:  Unknown  Strength/Motor Performance:  Unknown  Functional Recovery:  Unknown  Pain Control:  Unknown  Home Program:  inst. in HEP  Other:  NA    REASON FOR DISCHARGE:  Patient did not return for follow-up     Prognosis at time of discharge:  good  Comments:  NA    Thank you [facility] the [facility] of this [facility].    Jani GravelAmanda Karlsson, PT

## 2017-01-19 ENCOUNTER — Ambulatory Visit: Payer: BLUE CROSS/BLUE SHIELD | Attending: Neurology | Admitting: Neurology

## 2017-01-19 ENCOUNTER — Encounter: Payer: Self-pay | Admitting: Neurology

## 2017-01-19 VITALS — BP 120/80 | HR 84 | Temp 99.7°F | Wt 139.2 lb

## 2017-01-19 DIAGNOSIS — F439 Reaction to severe stress, unspecified: Secondary | ICD-10-CM | POA: Insufficient documentation

## 2017-01-19 DIAGNOSIS — Z720 Tobacco use: Secondary | ICD-10-CM | POA: Insufficient documentation

## 2017-01-19 DIAGNOSIS — R350 Frequency of micturition: Secondary | ICD-10-CM

## 2017-01-19 DIAGNOSIS — Z13220 Encounter for screening for lipoid disorders: Secondary | ICD-10-CM | POA: Insufficient documentation

## 2017-01-19 DIAGNOSIS — R5383 Other fatigue: Secondary | ICD-10-CM | POA: Insufficient documentation

## 2017-01-19 DIAGNOSIS — R300 Dysuria: Secondary | ICD-10-CM | POA: Insufficient documentation

## 2017-01-19 LAB — URINALYSIS WITH MICROSCOPIC
Blood,UA: NEGATIVE
Ketones, UA: NEGATIVE
Leuk Esterase,UA: NEGATIVE
Nitrite,UA: NEGATIVE
Protein,UA: NEGATIVE mg/dL
RBC,UA: 1 /hpf (ref 0–2)
Specific Gravity,UA: 1.023 (ref 1.002–1.030)
WBC,UA: <1 /hpf (ref 0–5)
pH,UA: 7 (ref 5.0–8.0)

## 2017-01-19 LAB — POCT URINALYSIS DIPSTICK
Glucose,UA POCT: NORMAL mg/dL
Ketones,UA POCT: NEGATIVE mg/dL
Lot #: 29981701
Nitrite,UA POCT: NEGATIVE
PH,UA POCT: 8 (ref 5–8)
Specific gravity,UA POCT: 1.015 (ref 1.002–1.030)
Urobilinogen,UA: NORMAL mg/dL

## 2017-01-20 ENCOUNTER — Other Ambulatory Visit: Payer: Self-pay | Admitting: Primary Care

## 2017-01-20 LAB — AEROBIC CULTURE: Aerobic Culture: 0

## 2017-01-20 MED ORDER — CIPROFLOXACIN HCL 500 MG PO TABS *I*
500.0000 mg | ORAL_TABLET | Freq: Two times a day (BID) | ORAL | 0 refills | Status: DC
Start: 2017-01-20 — End: 2017-02-18

## 2017-01-21 ENCOUNTER — Ambulatory Visit
Admission: RE | Admit: 2017-01-21 | Discharge: 2017-01-21 | Disposition: A | Discharge status: Home / Self Care | Payer: BLUE CROSS/BLUE SHIELD | Source: Ambulatory Visit

## 2017-01-21 DIAGNOSIS — R102 Pelvic and perineal pain: Secondary | ICD-10-CM

## 2017-01-21 DIAGNOSIS — Z720 Tobacco use: Secondary | ICD-10-CM

## 2017-01-21 DIAGNOSIS — R05 Cough: Secondary | ICD-10-CM

## 2017-01-21 DIAGNOSIS — R3129 Other microscopic hematuria: Secondary | ICD-10-CM

## 2017-01-22 ENCOUNTER — Telehealth: Payer: Self-pay | Admitting: Primary Care

## 2017-01-22 NOTE — Telephone Encounter (Signed)
Marchelle Folksmanda spoke with him with result note instructions

## 2017-01-22 NOTE — Telephone Encounter (Signed)
Pt looking for xray results(told pt no stones) but now is having back pain 5 out of 10.  Please advise

## 2017-01-24 NOTE — Progress Notes (Signed)
Subjective:     Patient ID: Carlos Dixon is a 36 y.o. male.    HPI  1 week hx of dysuria and urinary frequency.  Suprapubic pressure.  No flank pain or hematuria..  Sexually active monogamous.  Denies penile exudate.  Denies fever or chills.      Increased stress.  New job - start time 2 AM.  Endorses fatigue. Sleeping less than 5 hours/night. 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  Recently named POA for grandmother.      Tobacco use 1 PPD.  Denies SOB, wheeze, hemoptysis.    Father died of lung cancer - age 36.     Patient's medications, allergies, past medical, surgical, social and family histories were reviewed and updated as appropriate.  Patient Active Problem List   Diagnosis Code    Closed Fracture Of The Clavicle S42.009A    Head External Swelling (___ Cm) R22.0, R22.1    S/P appy Z90.49    Tendonitis of upper biceps tendon of right shoulder M75.21    Impingement syndrome of shoulder region, right M75.41    Scapular dyskinesis--right G25.89     Current Outpatient Prescriptions   Medication    cholecalciferol (VITAMIN D) 1000 UNIT tablet    Misc Natural Products (GLUCOSAMINE CHOND COMPLEX/MSM PO)    vitamins A-D-E-K (AQUADEKS) chewable tablet    ibuprofen (ADVIL) 200 MG tablet    loratadine (CLARITIN) 10 MG tablet    ciprofloxacin (CIPRO) 500 MG tablet     No current facility-administered medications for this visit.        Review of Systems   Constitutional: Positive for fatigue. Negative for chills and fever.   Gastrointestinal: Negative for abdominal pain.   Genitourinary: Positive for dysuria and frequency. Negative for discharge, hematuria and penile pain.   Psychiatric/Behavioral: Positive for sleep disturbance. The patient is nervous/anxious.            Objective:   Physical Exam  Blood pressure 120/80, pulse 84, temperature 37.6 C (99.7 F), temperature source Temporal, weight 63.1 kg (139 lb 3.2 oz).  Yassin appears comfortable.  Affect anxious.  No pallor.  Sclera anicteric.  Neck supple.   Lungs CTA.  RRR.  Normal bowel sounds - belly soft.  No suprapubic or CVA tenderness.      Specific gravity,UA POCT 1.015  1.002 - 1.030 Final   PH,UA POCT 8.0  5 - 8 Final   Leuk Esterase,UA POCT Trace   Negative Final   Nitrite,UA POCT Negative  Negative Final   Protein,UA POCT Trace   Negative mg/dL Final   Glucose,UA POCT Normal  Normal mg/dL Final   Ketones,UA POCT Negative  Negative mg/dL Final   Urobilinogen,UA Normal  Less than 1 mg/dL Final   Bilirubin,Ur +   Negative Final   Blood,UA POCT Trace   Negative Final             Assessment/Plan:      1.  Dysuria & urinary frequency.         Microhematuria.  Urinalysis and urine culture pending.   KUB pending - r/o renal calculi.   Ciprofloxacin x 5 days.  Hydration encouraged.     2.  Tobacco abuse.         Family hx of premature lung cancer.  CXR pending.  He understands the importance of smoking cessation.      3.  Fatigue.  Multifactorial.  Lab work pending, including CMP, CBC, TSH.    4.  Stress.  Encouraged him to focus on balanced nutrition, exercise and sleep hygiene.     Telephone FU

## 2017-02-18 ENCOUNTER — Ambulatory Visit: Payer: BLUE CROSS/BLUE SHIELD | Attending: Neurology | Admitting: Neurology

## 2017-02-18 ENCOUNTER — Encounter: Payer: Self-pay | Admitting: Neurology

## 2017-02-18 VITALS — BP 130/86 | HR 87 | Wt 140.4 lb

## 2017-02-18 DIAGNOSIS — F32A Depression, unspecified: Secondary | ICD-10-CM

## 2017-02-18 DIAGNOSIS — F329 Major depressive disorder, single episode, unspecified: Secondary | ICD-10-CM

## 2017-02-18 DIAGNOSIS — R5383 Other fatigue: Secondary | ICD-10-CM

## 2017-02-18 DIAGNOSIS — F419 Anxiety disorder, unspecified: Secondary | ICD-10-CM

## 2017-02-18 MED ORDER — ESCITALOPRAM OXALATE 10 MG PO TABS *I*
10.0000 mg | ORAL_TABLET | Freq: Every day | ORAL | 2 refills | Status: DC
Start: 2017-02-18 — End: 2017-05-28

## 2017-02-28 NOTE — Progress Notes (Signed)
Subjective:     Patient ID: Lane Hackerdam Broadus is a 37 y.o. male.    HPI Persistent anxiety & depression.  Feels overwhelmed.  Irritable.  Emotional.  Affecting personal and professional dynamics.  Not sleeping well.  Endorses fatigue. Is receptive to medication.  Counseling cost prohibitive.     Patient's medications, allergies, past medical, surgical, social and family histories were reviewed and updated as appropriate.  Current Outpatient Prescriptions   Medication    Multiple Vitamins-Minerals (MULTIVITAMIN ADULTS PO)    cholecalciferol (VITAMIN D) 1000 UNIT tablet    Misc Natural Products (GLUCOSAMINE CHOND COMPLEX/MSM PO)    ibuprofen (ADVIL) 200 MG tablet    loratadine (CLARITIN) 10 MG tablet    escitalopram (LEXAPRO) 10 MG tablet     No current facility-administered medications for this visit.        Review of Systems   Constitutional: Positive for fatigue.   Psychiatric/Behavioral: Positive for decreased concentration and sleep disturbance. Negative for suicidal ideas. The patient is nervous/anxious.            Objective:   Physical Exam  Blood pressure 130/86, pulse 87, weight 63.7 kg (140 lb 6.4 oz).  Appears comfortable.  Groomed appropriately.  Mentation and speech clear.  Affect appropriate.          Assessment:      Anxiety & depression.   Fatigue.        Plan:      Escitalopram 10 mg once daily.    Reinforced importance of balanced nutrition, exercise and sleep hygiene.   FU 4 weeks.

## 2017-04-01 ENCOUNTER — Ambulatory Visit: Payer: BLUE CROSS/BLUE SHIELD | Admitting: Neurology

## 2017-05-28 ENCOUNTER — Other Ambulatory Visit: Payer: Self-pay | Admitting: Primary Care

## 2017-05-28 MED ORDER — ESCITALOPRAM OXALATE 10 MG PO TABS *I*
10.0000 mg | ORAL_TABLET | Freq: Every day | ORAL | 0 refills | Status: DC
Start: 2017-05-28 — End: 2017-06-22

## 2017-06-22 ENCOUNTER — Other Ambulatory Visit: Payer: Self-pay | Admitting: Primary Care

## 2017-06-22 ENCOUNTER — Encounter: Payer: Self-pay | Admitting: Primary Care

## 2017-06-22 ENCOUNTER — Ambulatory Visit: Payer: BLUE CROSS/BLUE SHIELD | Attending: Primary Care | Admitting: Primary Care

## 2017-06-22 VITALS — BP 116/80 | HR 127 | Temp 99.5°F | Wt 146.2 lb

## 2017-06-22 DIAGNOSIS — R000 Tachycardia, unspecified: Secondary | ICD-10-CM | POA: Insufficient documentation

## 2017-06-22 DIAGNOSIS — R319 Hematuria, unspecified: Secondary | ICD-10-CM

## 2017-06-22 DIAGNOSIS — M549 Dorsalgia, unspecified: Secondary | ICD-10-CM

## 2017-06-22 DIAGNOSIS — R5383 Other fatigue: Secondary | ICD-10-CM | POA: Insufficient documentation

## 2017-06-22 DIAGNOSIS — F419 Anxiety disorder, unspecified: Secondary | ICD-10-CM | POA: Insufficient documentation

## 2017-06-22 LAB — URINALYSIS WITH MICROSCOPIC
Nitrite,UA: NEGATIVE
Protein,UA: 30 mg/dL — AB
RBC,UA: 1 /hpf (ref 0–2)
Specific Gravity,UA: 1.033 — ABNORMAL HIGH (ref 1.002–1.030)
WBC,UA: 1 /hpf (ref 0–5)
pH,UA: 5 (ref 5.0–8.0)

## 2017-06-22 LAB — POCT URINALYSIS DIPSTICK
Bilirubin,Ur: NEGATIVE
Glucose,UA POCT: NORMAL mg/dL
Ketones,UA POCT: NEGATIVE mg/dL
Lot #: 32222103
Nitrite,UA POCT: NEGATIVE
PH,UA POCT: 5 (ref 5–8)
Specific gravity,UA POCT: 1.02 (ref 1.002–1.030)
Urobilinogen,UA: NORMAL mg/dL

## 2017-06-22 MED ORDER — ESCITALOPRAM OXALATE 20 MG PO TABS *I*
20.0000 mg | ORAL_TABLET | Freq: Every day | ORAL | 5 refills | Status: DC
Start: 2017-06-22 — End: 2018-01-10

## 2017-06-22 NOTE — Progress Notes (Signed)
Subjective:     Patient ID: Carlos Dixon is a 37 y.o. male.    Hematuria  Gross this am  Mild back pain, no fever no dysuria, + tobacco  Inc anxiety        Ruffus has S/P appy and Impingement syndrome of shoulder region, right on his problem list.  Jeyden has a current medication list which includes the following prescription(s): escitalopram, multiple vitamins-minerals, cholecalciferol, misc natural products, ibuprofen, and loratadine.    Review of Systems        Objective:   Physical Exam   Cardiovascular: Normal rate and regular rhythm.    Pulmonary/Chest: Effort normal and breath sounds normal.   Abdominal: Soft. Bowel sounds are normal.   No CVAT   Vitals reviewed.          Assessment:      1.  Hematuria: Referral for CT urogram, urine culture, likely urology referral if workup negative, further plan based on results  2.  Tobacco: Discussed quitting  3.  Anxiety: Multiple life events, increase SSRI  4.  Tachycardia: Likely secondary to anxiety, check blood work       Plan:      Follow-up based on results

## 2017-06-23 ENCOUNTER — Ambulatory Visit: Payer: BLUE CROSS/BLUE SHIELD

## 2017-06-23 LAB — AEROBIC CULTURE: Aerobic Culture: 0

## 2017-06-24 ENCOUNTER — Ambulatory Visit
Admission: RE | Admit: 2017-06-24 | Discharge: 2017-06-24 | Disposition: A | Discharge status: Home / Self Care | Payer: BLUE CROSS/BLUE SHIELD | Source: Ambulatory Visit

## 2017-06-24 DIAGNOSIS — R319 Hematuria, unspecified: Secondary | ICD-10-CM

## 2017-06-24 MED ORDER — IOHEXOL 350 MG/ML (OMNIPAQUE) IV SOLN *I*
1.0000 mL | Freq: Once | INTRAVENOUS | Status: AC
Start: 2017-06-24 — End: 2017-06-24
  Administered 2017-06-24: 100 mL via INTRAVENOUS

## 2017-06-25 ENCOUNTER — Encounter: Payer: Self-pay | Admitting: Urology

## 2017-06-25 ENCOUNTER — Telehealth: Payer: Self-pay | Admitting: Urology

## 2017-06-25 NOTE — Telephone Encounter (Signed)
A user error has taken place: encounter opened in error, closed for administrative reasons.

## 2017-06-25 NOTE — Telephone Encounter (Addendum)
Tresa Endo from Dr. Tanda Rockers office called, patient is scheduled for an NPV on:    Date: 5/30  Provider: NP Mammie Russian  Diagnosis/Symptoms: gross hematuria    Is the appointment over 2 weeks out? yes  Does the patient want a sooner appointment if available? yes  Is the patient ok with waiting until the date scheduled to be seen? no  Did patient request a specific provider?(WHO) no  Would the patient like to be seen at a particular location?(WHERE) no     Patient requests late afternoon, Wed/Thurs    The patient can be reached at [phone] if necessary.    Caller can be reached at [phone] if necessary.

## 2017-06-28 NOTE — Telephone Encounter (Signed)
Left message for pt to call office to reschedule npv with Clarise Cruz, NP  Please warm transfer to Lenvil Swaim

## 2017-07-22 ENCOUNTER — Ambulatory Visit: Payer: BLUE CROSS/BLUE SHIELD | Admitting: Urology

## 2018-01-10 ENCOUNTER — Other Ambulatory Visit: Payer: Self-pay | Admitting: Primary Care

## 2018-05-16 ENCOUNTER — Telehealth: Payer: Self-pay | Admitting: Primary Care

## 2018-05-16 NOTE — Telephone Encounter (Signed)
Spoke w/pt. °

## 2018-05-16 NOTE — Telephone Encounter (Signed)
Pt called is a Careers [facility]- they sent him home- x 2 days of non prod cough, low grade fever(99.1), congestion.  Kids are sick- have lung infections(tx @ peds-swabbed neg for flu)  Please advise

## 2018-05-31 ENCOUNTER — Telehealth: Payer: Self-pay | Admitting: Primary Care

## 2018-05-31 NOTE — Telephone Encounter (Signed)
Lm for pt to call the office

## 2018-05-31 NOTE — Telephone Encounter (Signed)
Pt c/o poor urine flow, dark urine, constipation (able to go but bowels are "balls"), bloating and gerd for the past month. He also notes abdominal discomfort. Pt denies any fevers.

## 2018-05-31 NOTE — Telephone Encounter (Signed)
Please schedule OV for today - abdominal exam, urine check, possible labs.    Confirm no URI symptoms.

## 2018-06-01 NOTE — Telephone Encounter (Signed)
Lm for pt to call back

## 2018-06-14 NOTE — Telephone Encounter (Signed)
L/M for pt- stating if he needs an appt we are here to see him   please see messages in chart

## 2018-06-15 NOTE — Telephone Encounter (Signed)
Pt has not called me back- I've tried him a few times

## 2018-06-16 ENCOUNTER — Telehealth: Payer: Self-pay | Admitting: Primary Care

## 2018-06-16 NOTE — Telephone Encounter (Signed)
Pt says sx have resolved and has no acute issues. I asked if he was sure more than once and he assured me yes. I told him to call back if sx come back.

## 2018-06-17 NOTE — Telephone Encounter (Signed)
Left detailed vm- if I dont hear back soon I will be contacting his wife

## 2018-06-24 NOTE — Telephone Encounter (Signed)
Its been 7 days- still hant heard back from this pt. I left a vm for wife for pt to call office and sched ov, tele or zoom

## 2018-10-01 ENCOUNTER — Other Ambulatory Visit: Payer: Self-pay | Admitting: Primary Care

## 2019-01-01 ENCOUNTER — Ambulatory Visit
Admission: AD | Admit: 2019-01-01 | Discharge: 2019-01-01 | Disposition: A | Discharge status: Home / Self Care | Payer: BLUE CROSS/BLUE SHIELD | Source: Ambulatory Visit | Attending: Emergency Medicine | Admitting: Emergency Medicine

## 2019-01-01 DIAGNOSIS — Z20828 Contact with and (suspected) exposure to other viral communicable diseases: Secondary | ICD-10-CM

## 2019-01-01 LAB — COVID-19 PCR

## 2019-01-01 LAB — COVID-19 NAAT (PCR): COVID-19 NAAT (PCR): NEGATIVE

## 2019-01-01 NOTE — ED Triage Notes (Signed)
Patient presenting to Urgent Care for testing only. COVID-19 test ordered by Urgent Care Provider     Patient occupation: None of the above    Does this patient currently have symptoms concerning for COVID-19?: No     What is the reason for testing?: Routine ambulatory       Nasal swab obtained and sent for analysis.         Triage Note   [name] W [name], RN

## 2019-01-02 ENCOUNTER — Other Ambulatory Visit: Payer: Self-pay | Admitting: Primary Care

## 2019-01-09 ENCOUNTER — Other Ambulatory Visit: Payer: Self-pay | Admitting: Primary Care

## 2019-01-09 MED ORDER — ESCITALOPRAM OXALATE 20 MG PO TABS *I*
20.0000 mg | ORAL_TABLET | Freq: Every day | ORAL | 0 refills | Status: DC
Start: 2019-01-09 — End: 2019-01-16

## 2019-01-16 ENCOUNTER — Ambulatory Visit: Payer: BLUE CROSS/BLUE SHIELD | Admitting: Primary Care

## 2019-01-16 ENCOUNTER — Other Ambulatory Visit: Payer: Self-pay | Admitting: Primary Care

## 2019-01-16 ENCOUNTER — Encounter: Payer: Self-pay | Admitting: Primary Care

## 2019-01-16 VITALS — BP 144/100 | HR 82 | Wt 155.2 lb

## 2019-01-16 DIAGNOSIS — R221 Localized swelling, mass and lump, neck: Secondary | ICD-10-CM

## 2019-01-16 DIAGNOSIS — I10 Essential (primary) hypertension: Secondary | ICD-10-CM | POA: Insufficient documentation

## 2019-01-16 DIAGNOSIS — R5383 Other fatigue: Secondary | ICD-10-CM

## 2019-01-16 LAB — PCMH DEPRESSION ASSESSMENT

## 2019-01-16 MED ORDER — ESCITALOPRAM OXALATE 20 MG PO TABS *I*
20.0000 mg | ORAL_TABLET | Freq: Every day | ORAL | 3 refills | Status: DC
Start: 2019-01-16 — End: 2019-04-24

## 2019-01-16 NOTE — Progress Notes (Signed)
Subjective:     Patient ID: Antjuan Rothe is a 38 y.o. male.    Moving to [place], bp high, anxiety ok, on meds, bp high, wgt stable no etoh,       Tandy has S/P appy; Impingement syndrome of shoulder region, right; Anxiety; and Essential hypertension on their problem list.  Elieser has a current medication list which includes the following prescription(s): escitalopram, multiple vitamins-minerals, cholecalciferol, misc natural products, ibuprofen, and loratadine.    Review of Systems        Objective:   Physical Exam   Constitutional: No distress.   Cardiovascular: Normal rate and regular rhythm.   Pulmonary/Chest: Effort normal and breath sounds normal.   Musculoskeletal:         General: No edema.   Vitals reviewed.          Assessment:      1.  Fatigue: Continue citalopram, encourage daily exercise routine, consider counseling  2.  Hypertension: Aggressive lifestyle changes, check SMA-7, repeat blood pressure check in 3 weeks, low threshold for meds  3.  Cholesterol: Recheck profile  4.  Lump on neck: Lipoma versus sebaceous cyst, referral to surgery for removal       Plan:      Follow-up as above

## 2019-01-17 ENCOUNTER — Other Ambulatory Visit: Payer: Self-pay | Admitting: Primary Care

## 2019-01-30 ENCOUNTER — Ambulatory Visit: Payer: BLUE CROSS/BLUE SHIELD | Admitting: Surgery

## 2019-01-30 ENCOUNTER — Encounter: Payer: Self-pay | Admitting: Surgery

## 2019-01-30 VITALS — BP 138/82 | HR 72 | Temp 97.7°F | Resp 18 | Ht 68.0 in | Wt 153.0 lb

## 2019-01-30 DIAGNOSIS — M7989 Other specified soft tissue disorders: Secondary | ICD-10-CM

## 2019-02-05 NOTE — Progress Notes (Signed)
+   mass in mid back sore, mobile     I had a long discussion with the patient about the pathophysiology of lipomas and other soft tissue masses and the treatment options. These include observation or excision. I discussed the process of excision and its risks, including bleeding, infection, recurrence. We could do this either under local anesthesia alone or with sedation. Given the options he wishes to proceed with excision without sedation. We will get him scheduled at his earliest convenience. All of his questions were answered.

## 2019-02-06 ENCOUNTER — Ambulatory Visit: Payer: BLUE CROSS/BLUE SHIELD | Admitting: Primary Care

## 2019-02-07 ENCOUNTER — Ambulatory Visit: Payer: BLUE CROSS/BLUE SHIELD | Admitting: Primary Care

## 2019-02-09 ENCOUNTER — Encounter: Payer: Self-pay | Admitting: Primary Care

## 2019-02-09 ENCOUNTER — Ambulatory Visit: Payer: BLUE CROSS/BLUE SHIELD | Admitting: Primary Care

## 2019-02-09 VITALS — BP 130/88 | HR 82 | Wt 151.0 lb

## 2019-02-09 DIAGNOSIS — R5383 Other fatigue: Secondary | ICD-10-CM

## 2019-02-09 DIAGNOSIS — I10 Essential (primary) hypertension: Secondary | ICD-10-CM

## 2019-02-09 NOTE — Progress Notes (Signed)
Subjective:     Patient ID: Carlos Dixon is a 38 y.o. male.    Fu fatigue anxiety, better w ssri, needs bw, bp borderline, inc stress,       Carlos Dixon has S/P appy; Impingement syndrome of shoulder region, right; Anxiety; and Essential hypertension on their problem list.  Carlos Dixon has a current medication list which includes the following prescription(s): escitalopram, multiple vitamins-minerals, cholecalciferol, misc natural products, ibuprofen, and loratadine.    Review of Systems        Objective:   Physical Exam   Cardiovascular: Normal rate and regular rhythm.   Pulmonary/Chest: Effort normal and breath sounds normal.   Musculoskeletal:         General: No edema.   Vitals reviewed.          Assessment:      1.  Fatigue/anxiety: Doing better, titrate escitalopram to 20 mg daily, encourage daily exercise routine, follow-up 5 months to reassess  2.  Elevated blood pressure: Aggressive lifestyle changes as above, recheck on follow-up, follow SMA-7       Plan:      Follow-up as above

## 2019-02-16 ENCOUNTER — Other Ambulatory Visit: Payer: Self-pay | Admitting: Surgery

## 2019-02-16 DIAGNOSIS — Z01818 Encounter for other preprocedural examination: Secondary | ICD-10-CM

## 2019-02-16 NOTE — Telephone (Signed)
Pre-Operative Instructions                     FOLLOW YOUR SURGEON'S INSTRUCTIONS IF DIFFERENT THAN BELOW.                    PRIOR TO SURGERY    Five days before surgery, if surgeon does not specify, please STOP taking:    · Anti-inflammatory meds: (Ibuprofen, Motrin, Advil, Mobic, Meloxicam, Aleve, Naproxen, Voltaren, etc.)    · Vitamins and herbal supplements, including herbal teas     · YOU MAY TAKE ACETAMINOPHEN (TYLENOL) as needed    · Directions regarding your prescribed blood thinners, including aspirin, must be approved by your cardiologist or prescribing doctor.      ARRIVAL/SURGICAL TIME    · Staff from the [facility] will call you between 130PM and 4PM on the day before surgery to inform you of your arrival and surgery times.   · Please arrange for transportation to and from the hospital. You must have a responsible adult to stay with you after your surgery if you are going home the same day.  · Please know that you should not drive for 24 hours after receiving anesthesia.      DAY BEFORE SURGERY    · Keep yourself well hydrated to aid in the placement of your IV and for your general well-being.  · Do not eat anything after midnight the night before your surgery (including candy or gum).                                                                                           DAY OF SURGERY    · DO NOT CONSUME FOOD OF ANY KIND.    · Only Gatorade, clear apple juice or water from midnight until 2 hours before your scheduled surgery.    · Please bring photo identification and your insurance card with you for registration.    · Please wear loose, comfortable clothing and comfortable walking shoes.  Wear something that will easily accommodate a bandage or other type of dressing at your surgical site.    · DO NOT WEAR: BODY LOTION OR SCENTS.      · Please understand that rings and body piercings need to be removed and left at home.  If they are not removed, your surgery is at risk of  being delayed or cancelled.    · When you come to [place], please try not bring any valuable items with the exception of your phone.      · Valuables such as jewelry, cash and credit cards are best left at home during your stay.  Please send them home with family members.  If you are alone a staff member will assist you in storing your belongings.    · We  ask that your cell phone be with you and turned on, so that the Preop and OR RNs may phone you directly with instructions on the morning of surgery if necessary, and you can be in communication with your family.    ·  Hospital does not assume   responsibility for items brought with you on the day of surgery.    · If wearing eyeglasses, please bring a case.  DO NOT WEAR CONTACT LENSES.    · You may shower, brush your teeth, and use deodorant.    · Patient visitation is restricted at this time due to COVID-19 concerns.       MEDICATIONS: DAY OF SURGERY    · Take your medications as directed according to your printed, verbal or MyChart instructions.  · Anxiety and pain medications may be taken as prescribed at any time prior to arrival.   · Medications from the [facility] will be administered to you under the direction of your surgeon.   · Please leave your prescriptions at home with the exception of inhalers.        AT THE HOSPITAL ON THE DAY OF SURGERY    · Please enter the building through the Main Lobby.     · Please stop at the Information Desk and let them know you are here for surgery.      [facility]    The pharmacy is open from 9am to 5:30pm on weekdays and 10am-2pm on Saturdays.     Any prescriptions you may need after your surgery can be filled at the Outpatient Pharmacy in the main lobby:    · Pharmacy staff will coordinate obtaining your insurance information and co-payments, which will be identical to those of your home pharmacy.  · A credit card can be called in to the pharmacy in advance and stored  securely with  encryption, to be used only for medications prescribed for you.  · Please have your support person call the pharmacy with a credit card number during your surgery to expedite your discharge from the hospital.  · Cash or a check are acceptable forms of payment as well, and payment can be facilitated at the time of discharge by staff members accompanying you to your car.  · Prescriptions cannot be filled without payment. Thank you for your consideration.    QUESTIONS?    · Question about these instructions? Call [phone], select option 2, and leave a message at any time.   A nurse will return your call during our regular business hours.  · Any questions regarding specifics about your surgery or recovery? Please call your surgeon’s office.

## 2019-02-26 ENCOUNTER — Encounter: Payer: Self-pay | Admitting: Primary Care

## 2019-02-28 ENCOUNTER — Ambulatory Visit: Payer: BLUE CROSS/BLUE SHIELD | Attending: Surgery

## 2019-02-28 DIAGNOSIS — Z01812 Encounter for preprocedural laboratory examination: Secondary | ICD-10-CM | POA: Insufficient documentation

## 2019-02-28 DIAGNOSIS — Z20828 Contact with and (suspected) exposure to other viral communicable diseases: Secondary | ICD-10-CM | POA: Insufficient documentation

## 2019-02-28 DIAGNOSIS — Z20822 Contact with and (suspected) exposure to covid-19: Secondary | ICD-10-CM | POA: Insufficient documentation

## 2019-02-28 DIAGNOSIS — Z01818 Encounter for other preprocedural examination: Secondary | ICD-10-CM | POA: Insufficient documentation

## 2019-02-28 LAB — COVID-19 PCR

## 2019-02-28 LAB — COVID-19 NAAT (PCR): COVID-19 NAAT (PCR): NEGATIVE

## 2019-03-02 ENCOUNTER — Encounter: Payer: Self-pay | Admitting: Surgery

## 2019-03-03 ENCOUNTER — Ambulatory Visit: Payer: BLUE CROSS/BLUE SHIELD

## 2019-03-03 ENCOUNTER — Ambulatory Visit
Admission: RE | Admit: 2019-03-03 | Discharge: 2019-03-03 | Disposition: A | Discharge status: Home / Self Care | Payer: BLUE CROSS/BLUE SHIELD | Source: Ambulatory Visit | Attending: Surgery | Admitting: Surgery

## 2019-03-03 ENCOUNTER — Encounter: Payer: Self-pay | Admitting: Primary Care

## 2019-03-03 ENCOUNTER — Other Ambulatory Visit: Payer: Self-pay

## 2019-03-03 ENCOUNTER — Ambulatory Visit: Payer: BLUE CROSS/BLUE SHIELD | Admitting: Anesthesiology

## 2019-03-03 ENCOUNTER — Encounter
Admission: RE | Disposition: A | Discharge status: Home / Self Care | Payer: Self-pay | Source: Ambulatory Visit | Attending: Surgery

## 2019-03-03 DIAGNOSIS — R222 Localized swelling, mass and lump, trunk: Secondary | ICD-10-CM

## 2019-03-03 DIAGNOSIS — I10 Essential (primary) hypertension: Secondary | ICD-10-CM | POA: Insufficient documentation

## 2019-03-03 DIAGNOSIS — L720 Epidermal cyst: Secondary | ICD-10-CM | POA: Insufficient documentation

## 2019-03-03 DIAGNOSIS — F17210 Nicotine dependence, cigarettes, uncomplicated: Secondary | ICD-10-CM | POA: Insufficient documentation

## 2019-03-03 HISTORY — PX: PR EXC B9 LESION MRGN XCP SK TG T/A/L 2.1-3.0 CM: 11403

## 2019-03-03 SURGERY — EXCISION, MASS, BACK
Anesthesia: Monitor Anesthesia Care | Site: Back | Wound class: Clean

## 2019-03-03 MED ORDER — IBUPROFEN 600 MG PO TABS *I*
600.0000 mg | ORAL_TABLET | Freq: Three times a day (TID) | ORAL | 0 refills | Status: AC | PRN
Start: 2019-03-03 — End: 2019-04-02
  Filled 2019-03-03: qty 30, 10d supply, fill #0

## 2019-03-03 MED ORDER — LACTATED RINGERS IV SOLN *I*
20.0000 mL/h | INTRAVENOUS | Status: DC
Start: 2019-03-03 — End: 2019-03-03
  Administered 2019-03-03: 20 mL/h via INTRAVENOUS

## 2019-03-03 MED ORDER — SODIUM CHLORIDE 0.9 % FLUSH FOR PUMPS *I*
0.0000 mL/h | INTRAVENOUS | Status: DC | PRN
Start: 2019-03-03 — End: 2019-03-04

## 2019-03-03 MED ORDER — PROPOFOL 10 MG/ML IV EMUL (INTERMITTENT DOSING) WRAPPED *I*
INTRAVENOUS | Status: AC
Start: 2019-03-03 — End: 2019-03-03
  Filled 2019-03-03: qty 40

## 2019-03-03 MED ORDER — ALBUTEROL SULFATE (2.5 MG/3ML) 0.083% IN NEBU *I*
2.5000 mg | INHALATION_SOLUTION | Freq: Once | RESPIRATORY_TRACT | Status: DC | PRN
Start: 2019-03-03 — End: 2019-03-03

## 2019-03-03 MED ORDER — MEPERIDINE HCL 25 MG/ML IJ SOLN *I*
12.5000 mg | INTRAMUSCULAR | Status: DC | PRN
Start: 2019-03-03 — End: 2019-03-03

## 2019-03-03 MED ORDER — ACETAMINOPHEN 500 MG PO TABS *I*
1000.0000 mg | ORAL_TABLET | Freq: Three times a day (TID) | ORAL | 0 refills | Status: AC | PRN
Start: 2019-03-03 — End: 2019-04-02
  Filled 2019-03-03: qty 30, 5d supply, fill #0

## 2019-03-03 MED ORDER — ONDANSETRON HCL 2 MG/ML IV SOLN *I*
INTRAMUSCULAR | Status: DC | PRN
Start: 2019-03-03 — End: 2019-03-03
  Administered 2019-03-03: 4 mg via INTRAMUSCULAR

## 2019-03-03 MED ORDER — PROPOFOL 10 MG/ML IV EMUL (INTERMITTENT DOSING) WRAPPED *I*
INTRAVENOUS | Status: DC | PRN
Start: 2019-03-03 — End: 2019-03-03
  Administered 2019-03-03 (×2): 20 mg via INTRAVENOUS

## 2019-03-03 MED ORDER — PROPOFOL INFUSION 10 MG/ML *I*
INTRAVENOUS | Status: DC | PRN
Start: 2019-03-03 — End: 2019-03-03
  Administered 2019-03-03: 75 ug/kg/min via INTRAVENOUS
  Administered 2019-03-03: 50 ug/kg/min via INTRAVENOUS
  Administered 2019-03-03: 150 ug/kg/min via INTRAVENOUS

## 2019-03-03 MED ORDER — SODIUM CHLORIDE 0.9 % IV SOLN WRAPPED *I*
20.0000 mL/h | Status: DC
Start: 2019-03-03 — End: 2019-03-03

## 2019-03-03 MED ORDER — CEFAZOLIN 2000 MG IN STERILE WATER 20ML SYRINGE *I*
2000.0000 mg | PREFILLED_SYRINGE | Freq: Once | INTRAVENOUS | Status: AC
Start: 2019-03-03 — End: 2019-03-03
  Administered 2019-03-03: 2000 mg via INTRAVENOUS
  Filled 2019-03-03: qty 20

## 2019-03-03 MED ORDER — ONDANSETRON HCL 2 MG/ML IV SOLN *I*
1.0000 mg | Freq: Once | INTRAMUSCULAR | Status: DC | PRN
Start: 2019-03-03 — End: 2019-03-03

## 2019-03-03 MED ORDER — LIDOCAINE HCL (PF) 1 % IJ SOLN *I*
0.1000 mL | INTRAMUSCULAR | Status: DC | PRN
Start: 2019-03-03 — End: 2019-03-03
  Administered 2019-03-03: 0.1 mL via SUBCUTANEOUS
  Filled 2019-03-03: qty 2

## 2019-03-03 MED ORDER — MIDAZOLAM HCL 1 MG/ML IJ SOLN *I* WRAPPED
INTRAMUSCULAR | Status: AC
Start: 2019-03-03 — End: 2019-03-03
  Filled 2019-03-03: qty 2

## 2019-03-03 MED ORDER — MIDAZOLAM HCL 1 MG/ML IJ SOLN *I* WRAPPED
INTRAMUSCULAR | Status: DC | PRN
Start: 2019-03-03 — End: 2019-03-03
  Administered 2019-03-03: 2 mg via INTRAVENOUS

## 2019-03-03 MED ORDER — LACTATED RINGERS IV SOLN *I*
125.0000 mL/h | INTRAVENOUS | Status: DC
Start: 2019-03-03 — End: 2019-03-03

## 2019-03-03 MED ORDER — KETOROLAC TROMETHAMINE 30 MG/ML IJ SOLN *I*
INTRAMUSCULAR | Status: AC
Start: 2019-03-03 — End: 2019-03-03
  Filled 2019-03-03: qty 1

## 2019-03-03 MED ORDER — LIDOCAINE HCL 2 % IJ SOLN *I*
INTRAMUSCULAR | Status: DC | PRN
Start: 2019-03-03 — End: 2019-03-03
  Administered 2019-03-03: 30 mg via INTRAVENOUS

## 2019-03-03 MED ORDER — LIDOCAINE HCL 2 % (PF) IJ SOLN *I*
INTRAMUSCULAR | Status: AC
Start: 2019-03-03 — End: 2019-03-03
  Filled 2019-03-03: qty 5

## 2019-03-03 MED ORDER — DIPHENHYDRAMINE HCL 50 MG/ML IJ SOLN *I*
25.0000 mg | INTRAMUSCULAR | Status: DC | PRN
Start: 2019-03-03 — End: 2019-03-03

## 2019-03-03 MED ORDER — BUPIVACAINE-EPINEPHRINE 0.5 % IJ SOLUTION *WRAPPED*
INTRAMUSCULAR | Status: DC | PRN
Start: 2019-03-03 — End: 2019-03-03
  Administered 2019-03-03: 10 mL via SUBCUTANEOUS

## 2019-03-03 MED ORDER — HALOPERIDOL LACTATE 5 MG/ML IJ SOLN *I*
0.5000 mg | Freq: Once | INTRAMUSCULAR | Status: DC | PRN
Start: 2019-03-03 — End: 2019-03-03

## 2019-03-03 MED ORDER — HYDROMORPHONE HCL PF 1 MG/ML IJ SOLN *WRAPPED*
0.5000 mg | INTRAMUSCULAR | Status: DC | PRN
Start: 2019-03-03 — End: 2019-03-03

## 2019-03-03 MED ORDER — DEXTROSE 5 % FLUSH FOR PUMPS *I*
0.0000 mL/h | INTRAVENOUS | Status: DC | PRN
Start: 2019-03-03 — End: 2019-03-04

## 2019-03-03 MED ORDER — BUPIVACAINE-EPINEPHRINE 0.5 % IJ SOLUTION *WRAPPED*
INTRAMUSCULAR | Status: AC
Start: 2019-03-03 — End: 2019-03-03
  Filled 2019-03-03: qty 30

## 2019-03-03 MED ORDER — SODIUM CHLORIDE 0.9 % 100 ML IV SOLN *I*
6.2500 mg | Freq: Once | INTRAVENOUS | Status: DC | PRN
Start: 2019-03-03 — End: 2019-03-03

## 2019-03-03 SURGICAL SUPPLY — 14 items
APPLICATOR CHLORAPREP STER  ORANGE 26ML (Supply) ×3 IMPLANT
BLADE CLIPPER SURG (Supply) ×1
BLADE SUR CLIPPER W37.2MM CUT H0.23MM GEN PURP EXISTING CLP HNDL DISP (Supply) ×1 IMPLANT
BLADE SUR W37.2MM CUT H0.23MM GEN PURP EXISTING CLP HNDL DISP (Supply) ×1 IMPLANT
FILTER NEPTUNE 4PORT MANIFOLD (Supply) ×3 IMPLANT
GLOVE BIOGEL PI MICRO IND UNDER SZ 6.0 LF (Glove) ×2 IMPLANT
GLOVE BIOGEL PI MICRO SZ 6 (Glove) ×2 IMPLANT
GLOVE BIOGEL PI ORTHOPRO SZ 7.5 LF (Glove) ×3 IMPLANT
GLOVE SURG BIOGEL PI ULTRATOUCH SZ 7.5 (Glove) ×4 IMPLANT
NEEDLE HYPO BVL LF 25G X 1.5IN (Needle) ×3 IMPLANT
PACK CUSTOM GENERAL PACK (Pack) ×3 IMPLANT
SLEEVE COMP KNEE MED BLENDED (Supply) ×3 IMPLANT
SUTR MONOCRYL PLUS 4-0 18PS2 (Suture) ×3 IMPLANT
SUTR VICRYL ANITIB 3-0 SH 27 VIOLET (Suture) ×3 IMPLANT

## 2019-03-03 NOTE — Anesthesia Preprocedure Evaluation (Signed)
Anesthesia Pre-operative History and Physical for Carlos Dixon  .  .  .  Anesthesia Evaluation Information Source: patient, records     ANESTHESIA HISTORY  Pertinent(-):  No History of anesthetic complications or Family hx of anesthetic complications    GENERAL     Denies general issues  Pertinent (-):  No history of anesthetic complications or Family Hx of Anesthetic Complications    HEENT     Denies HEENT issues PULMONARY    + Smoker          tobacco currently  Pertinent(-):  No asthma, recent URI, sleep apnea or COPD    CARDIOVASCULAR  Good(4+METs) Exercise Tolerance    + Hypertension (no meds)          well controlled    GI/HEPATIC/RENAL    NPO Status  NPO    > 8hrs ago (solids) and > 2hrs ago (clears)  Pertinent(-):  No GERD, liver  issues or renal issues  NEURO/PSYCH  Pertinent(-):  No headaches, chronic pain, neuropsychiatric issues, seizures, cerebrovascular event or neuromuscular disease    ENDO/OTHER  Pertinent(-):  No diabetes mellitus, thyroid disease    HEMATOLOGIC     Denies hematologic issues         Physical Exam    Airway            Mouth opening: normal            Mallampati: II            TM distance (fb): >3 FB            Neck ROM: full  Dental   Normal Exam   Cardiovascular           Rhythm: regular           Rate: normal  No murmur         Pulmonary     breath sounds clear to auscultation    Mental Status     oriented to person, place and time         ________________________________________________________________________  PLAN  ASA Score  1  Anesthetic Plan MAC     Induction (routine IV) General Anesthesia/Sedation Maintenance Plan (propofol infusion); Airway (nasal cannula); Line ( use current access); Monitoring (standard ASA); Positioning (lateral decubitus); PONV Plan (ondansetron); Pain (per surgical team and intraop local); PostOp (ASC)    Informed Consent     Risks:          Risks discussed were commensurate with the plan listed above with the following specific points: sore throat and  N/V, Damage to: eyes, nerves and teeth, allergic Rx and unexpected serious injury, Serious heart, lung, neurologic injury including MI, heart failure, pneumonia, stroke  .    Anesthetic Consent:         Anesthetic plan (and risks as noted above) were discussed with patient    Responsible Anesthesia Attestation:  I attest that the patient or proxy understands and accepts the risks and benefits of the anesthesia plan. I also attest that I have personally performed a pre-anesthetic examination and evaluation, and prescribed the anesthetic plan for this particular location within 48 hours prior to the anesthetic as documented. Cleon Gustin, MD  03/03/19, 4:55 PM

## 2019-03-03 NOTE — Discharge Instructions (Signed)
[facility] Discharge Instructions    DATE: 03/03/2019   Procedure: right posterior neck mass excision    Physician: Gwenyth Allegra, MD    You have received sedative medication which may make you drowsy for as long as 24 hours.     A.) DO NOT drive or operate any machinery for 24 hours    B.) DO NOT drink alcoholic beverages for 24 hours    C.) DO NOT make major decisions, sign contracts, etc. for 24 hours      DIET:  Resume your previous diet   ACTIVITY:  Use ice pack. Walking is encouraged. Climb stairs as tolerated. No heavy lifiting (nothing heavier than 20 pounds) for 4 weeks or until cleared by surgeon.      CARE OF DRESSING OR INCISION:  Keep incision dry. Your incisions were covered with a liquid dressing. These will scab and peel off on their own.    BATHING/SHOWERING  May shower. No tub baths or swimming for at least 2 weeks. Pat incision dry after shower. Do not scrub incision.    FOLLOW-UP CARE  Call for an appointment with Dr. Lawerance Bach at Tel:[phone] if you do not have a follow up appointment scheduled in two weeks    PAIN:  You may have a narcotic medication prescribed to you for pain.  Major side effects include constipation.  It will benefit you to take Colace, Senna which are over the counter stool softeners. Please do not exceed 3000mg  of Acetaminophen over a 24 hour period      Call your doctor for the following problems: FEVER over 101 F/38.4 C; PAIN not relieved with pain medication ordered; SWELLING, REDNESS at incision site, heavy BLEEDING, foul DRAINAGE, INABILITY TO URINATE.     If you are unable to contact your doctor please call or go to [facility] Emergency Department [phone]  If you have any problems in regards to your anesthesia please call (days) [phone], have the operator page an anesthesiologist on call.

## 2019-03-03 NOTE — Telephone Encounter (Signed)
This patient attachment is not clinically relevant.  Please delete from their chart.    Thank you,  Torrin Crihfield L Eriyonna Matsushita

## 2019-03-03 NOTE — Anesthesia Procedure Notes (Signed)
---------------------------------------------------------------------------------------------------------------------------------------    AIRWAY   GENERAL INFORMATION AND STAFF    Patient location during procedure: OR       Date of Procedure: 03/03/2019 5:06 PM  CONDITION PRIOR TO MANIPULATION     Current Airway/Neck Condition:  Normal        For more airway physical exam details, see Anesthesia PreOp Evaluation  AIRWAY METHOD   Mask Difficulty Assessment:  0 - not attempted    Number of Attempts at Approach:  1  FINAL AIRWAY DETAILS    Final Airway Type:  Nasal cannula    Head position required to avoid obstruction:  Neutral  ----------------------------------------------------------------------------------------------------------------------------------------

## 2019-03-03 NOTE — H&P (Signed)
UPDATES TO PATIENT'S CONDITION on the DAY OF SURGERY/PROCEDURE    I. Updates to Patient's Condition (to be completed by a provider privileged to complete a H&P, following reassessment of the patient by the provider):    Day of Surgery/Procedure Update:  History  History reviewed and no change    Physical  Physical exam updated and no change            II. Procedure Readiness   I have reviewed the patient's H&P and updated condition. By completing and signing this form, I attest that this patient is ready for surgery/procedure.    III. Attestation   I have reviewed the updated information regarding the patient's condition and it is appropriate to proceed with the planned surgery/procedure.    I have reminded Mr. Hinch that COVID-19 is still present in our community. He was advised that [facility] and its affiliates have made deliberate and widespread changes to policies and procedures, consistent with applicable directives, in order to reduce the risk of exposure in our facilities.     I further explained and Mr. Lerew understands that given the communicability of the SARS CoV2 coronavirus, there remains a small but real risk of contracting the disease while receiving perioperative care - even with stringent preventive measures in place.   Mr. Elman understands the potential consequences of COVID-19 disease as it relates to their planned procedure and anticipated postoperative course.      The patient and I have considered and discussed the relative risks and benefits of proceeding with his/her surgery - both in terms of the procedure itself, and also in the context of the ongoing pandemic.  Mr. Neidhardt wishes to proceed with the procedure.     Niajah Sipos E Yasmyn Bellisario, MD as of 4:21 PM 03/03/2019

## 2019-03-03 NOTE — Anesthesia Case Conclusion (Signed)
CASE CONCLUSION  Emergence  Assessment:  Routine  Transport  Directly to: ASC  Position:  Recumbent  Patient Condition on Handoff  Level of Consciousness:  Alert/talking/calm  Patient Condition:  Stable  Handoff Report to:  RN

## 2019-03-03 NOTE — H&P (Signed)
Subjective: Patient coming in with a lipoma of the posterior aspect of his neck.  Patient was educated on the pathophysiology of lipomas and other soft tissue masses and treatment options.  This included observation or excision.  We discussed the process of excision is risks including bleeding, infection, recurrence.  Patient did decide at this time that he would like to undergo surgery.    Objective:  General: Alert and oriented x3  Cardiovascular: S1 and S2 heard, no murmurs  Respiratory: Lungs are clear to auscultation.  No increased work of breathing.  Abdomen: Soft, nontender, nondistended  Extremities: No edema, full range of motion.    Impression: Patient has a small 2 cm x 2 cm lipoma at the posterior aspect of his neck.  This is causing him some discomfort.    Plan: We will plan to excise this lipoma in the operating room on 03/03/2019.  Patient is consented.  Covid negative.  All questions were invited and answered.    Liem Copenhaver J. Allena Katz, MD  Orthopaedic Surgery Resident  03/03/2019 4:49 PM

## 2019-03-03 NOTE — INTERIM OP NOTE (Signed)
Diagnosis: right posterior neck mass   Procedure: right posterior neck mass excision  Planned return to OR: none  Antibiotic regimen: none  Pain Management: Tylenol  DVT/HO prophylaxis: Mechanical  Weight bearing status: AAT WBAT  Pin/Wound Care: dermabond over incision*  Additional Specifics: none, follow up in 1 week. F/u surgical pathology of excised posterior neck mass.      Interim Op Note (Surgical Log ID: [id number])       Date of Surgery: 03/03/2019       Surgeons: Surgeon(s) and Role:     * Burns, Estelle Grumbles, MD - Primary     * Luisa Dago, MD - Resident - Assisting       Pre-op Diagnosis: Pre-Op Diagnosis Codes:     * Mass on back [R22.2]       Post-op Diagnosis: Post-Op Diagnosis Codes:     * Mass on back [R22.2]       Procedure(s) Performed: Procedure(s) (LRB):  EXCISION, MASS, BACK=9 (N/A)       Anesthesia Type: Monitor Anesthesia Care        Fluid Totals: I/O this shift:  01/08 1500 - 01/08 2259  In: 700 (9.6 mL/kg) [I.V.:700]  Out: 1 (0 mL/kg) [Blood:1]  Net: 699  Weight: 72.6 kg        Estimated Blood Loss: 1 mL       Specimens to Pathology:  ID Type Source Tests Collected by Time Destination   A : posterior neck right mass TISSUE Tissue SURGICAL PATHOLOGY Maree Erie, RN 03/03/2019 1718           Temporary Implants:        Packing:                 Patient Condition: good       Findings (Including unexpected complications): As above, no complications, see op note.     Signed:  Joylene Draft, MD  on 03/03/2019 at 5:32 PM

## 2019-03-03 NOTE — Anesthesia Postprocedure Evaluation (Signed)
Anesthesia Post-Op Note    Patient: Carlos Dixon    Procedure(s) Performed:  Procedure Summary  Date:  03/03/2019 Anesthesia Start: 03/03/2019  4:54 PM Anesthesia Stop: 03/03/2019  5:59 PM Room / Location:  [id number]_OR_04 / [facility]   Procedure(s):  EXCISION, MASS, BACK=9 Diagnosis:  upper back mass Surgeon(s):  Burns, Virl Diamond, MD  Terrace Arabia, MD Responsible Anesthesia Provider:  Cleon Gustin, MD         Recovery Vitals  BP: 121/72 (03/03/2019  6:00 PM)  Heart Rate: 58 (03/03/2019  6:00 PM)  Resp: 16 (03/03/2019  6:00 PM)  Temp: 36.7 C (98.1 F) (03/03/2019  6:00 PM)  SpO2: 97 % (03/03/2019  6:00 PM)   0-10 Scale: 0 (03/03/2019  6:00 PM)    Anesthesia type:  MAC  Complications Noted During Procedure or in PACU:  None   Comment:    Patient Location:  [facility]  Level of Consciousness:    Recovered to baseline  Patient Participation:     Able to participate  Temperature Status:    Normothermic  Oxygen Saturation:    Within patient's normal range  Cardiac Status:   Within patient's normal range  Fluid Status:    Stable  Airway Patency:     Yes  Pulmonary Status:    Baseline  Pain Management:    Adequate analgesia  Nausea and Vomiting:  None    Post Op Assessment:    Tolerated procedure wellAttending Attestation:  All indicated post anesthesia care provided       -

## 2019-03-04 ENCOUNTER — Other Ambulatory Visit: Payer: Self-pay

## 2019-03-06 ENCOUNTER — Encounter: Payer: Self-pay | Admitting: Surgery

## 2019-03-08 LAB — SURGICAL PATHOLOGY

## 2019-03-16 ENCOUNTER — Encounter: Payer: Self-pay | Admitting: Surgery

## 2019-03-16 ENCOUNTER — Ambulatory Visit: Payer: BLUE CROSS/BLUE SHIELD | Admitting: Surgery

## 2019-03-16 DIAGNOSIS — Z4889 Encounter for other specified surgical aftercare: Secondary | ICD-10-CM | POA: Insufficient documentation
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
## 2019-03-16 NOTE — Patient Instructions (Signed)
General Surgery Post-operative instructions    Activity: No restriction.    Diet: Resume pre-operative diet    Wound care: Wash incision with antibacterial soap  Take a shower, pat incision dry  Do not bathe or immerse in water for 2 weeks

## 2019-03-22 NOTE — Op Note (Signed)
Operative Note (Surgical Case/Log ID: [id number])       Date of Surgery: 03/03/2019       Surgeons: Moishe Spice) and Role:     * Kaelie Henigan, Estelle Grumbles, MD - Primary     * Luisa Dago, MD - Resident - Assisting       Pre-op Diagnosis: Pre-Op Diagnosis Codes:     * Mass on back [R22.2]       Post-op Diagnosis: Post-Op Diagnosis Codes:     * Mass on back [R22.2]       Procedure(s) Performed: Procedure(s) (LRB):  EXCISION, MASS, BACK=9 (N/A)       Anesthesia Type: Monitor Anesthesia Care        Fluid Totals: No intake/output data recorded.       Estimated Blood Loss: 1 mL       Specimens to Pathology:  ID Type Source Tests Collected by Time Destination   A : posterior neck right mass TISSUE Tissue SURGICAL PATHOLOGY Maree Erie, RN 03/03/2019 1718           Temporary Implants:        Packing:                 Patient Condition: good       Indications: Soft tissue mass       Findings (Including unexpected complications): same     Description of Procedure: The appropriate site for surgery was marked in the preanesthesia area. He was brought to the OR and placed in the supine position. He tolerated anesthesia. Area prepped and draped in usual sterile fashion. Preop time carried out per protocol. Area anesthetized with approximately 10 cc of 1% Lidocaine with Epinephrine mixed 1:1 with 0.25 % marcaine. A transverse incision was made in the skin and the mass was dissected free from the surrounding tissue. The mass was approximately 3.5 x 2.5 x 2.0 cm. Adequate hemostasis observed. Wound closed with interrupted 3.0 vicryl. Dermabond used for dressing. Pt tolerated procedure well. He was aroused from anesthesia and transported in stable condition to the PACU. All sponge needle and instrument counts were correct. EBL minimal.    Signed:  Kachina Niederer E Odaliz Mcqueary, MD  on 03/22/2019 at 12:27 PM

## 2019-04-05 ENCOUNTER — Encounter: Payer: Self-pay | Admitting: Primary Care

## 2019-04-24 ENCOUNTER — Other Ambulatory Visit: Payer: Self-pay | Admitting: Primary Care

## 2019-04-24 MED ORDER — ESCITALOPRAM OXALATE 20 MG PO TABS *I*
20.0000 mg | ORAL_TABLET | Freq: Every day | ORAL | 0 refills | Status: DC
Start: 2019-04-24 — End: 2023-10-28

## 2019-05-13 ENCOUNTER — Other Ambulatory Visit: Payer: Self-pay | Admitting: Pulmonary Disease

## 2019-05-13 DIAGNOSIS — Z23 Encounter for immunization: Secondary | ICD-10-CM

## 2019-05-21 ENCOUNTER — Other Ambulatory Visit: Payer: Self-pay | Admitting: Pulmonary Disease

## 2019-05-21 DIAGNOSIS — Z23 Encounter for immunization: Secondary | ICD-10-CM

## 2019-07-11 ENCOUNTER — Ambulatory Visit: Payer: BLUE CROSS/BLUE SHIELD | Admitting: Primary Care

## 2019-11-29 ENCOUNTER — Other Ambulatory Visit: Payer: Self-pay | Admitting: Orthopedic Surgery

## 2019-11-29 DIAGNOSIS — M50123 Cervical disc disorder at C6-C7 level with radiculopathy: Secondary | ICD-10-CM

## 2019-11-29 DIAGNOSIS — M5412 Radiculopathy, cervical region: Secondary | ICD-10-CM

## 2019-11-29 DIAGNOSIS — M5030 Other cervical disc degeneration, unspecified cervical region: Secondary | ICD-10-CM

## 2019-12-18 ENCOUNTER — Other Ambulatory Visit: Payer: Self-pay

## 2019-12-18 ENCOUNTER — Ambulatory Visit
Admission: RE | Admit: 2019-12-18 | Discharge: 2019-12-18 | Disposition: A | Discharge status: Home / Self Care | Payer: Self-pay | Source: Ambulatory Visit | Attending: Orthopedic Surgery | Admitting: Orthopedic Surgery

## 2019-12-18 DIAGNOSIS — M5030 Other cervical disc degeneration, unspecified cervical region: Secondary | ICD-10-CM

## 2019-12-18 DIAGNOSIS — M5412 Radiculopathy, cervical region: Secondary | ICD-10-CM

## 2019-12-18 DIAGNOSIS — M50123 Cervical disc disorder at C6-C7 level with radiculopathy: Secondary | ICD-10-CM

## 2019-12-18 IMAGING — MR MR CERVICAL SPINE W/O CM
5 series · 35 of 48 positions shown · non-contrast
Comparison: None.

CLINICAL DATA: Right arm pain

EXAM:
MRI CERVICAL SPINE WITHOUT CONTRAST
TECHNIQUE: Multiplanar, multisequence MR imaging of the cervical spine was
performed. No intravenous contrast was administered.

[Series 2: T2 · sagittal · 3.0mm · 0.41mm/px · 7 of 17 slices shown (1 of 2)]
[im 1/17]
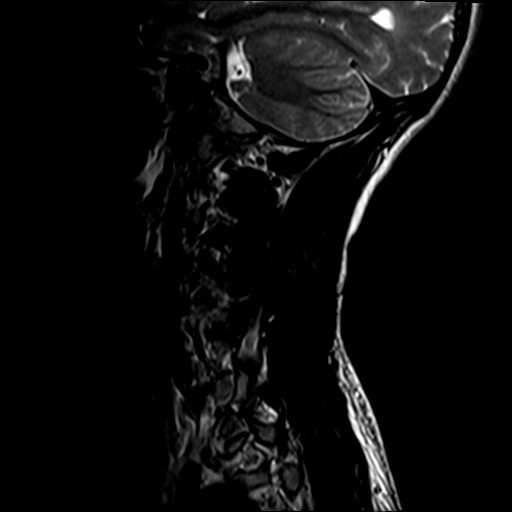
[im 3/17]
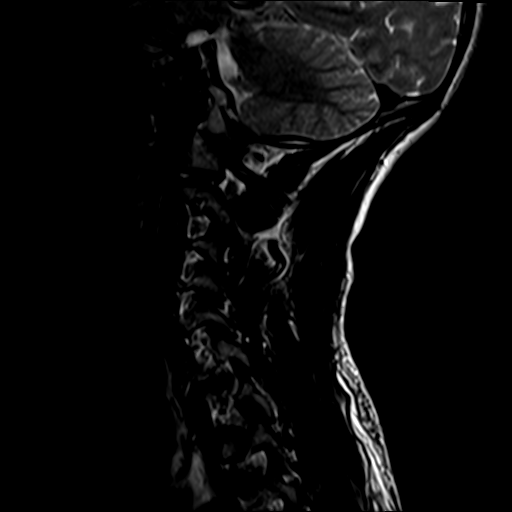
[im 6/17]
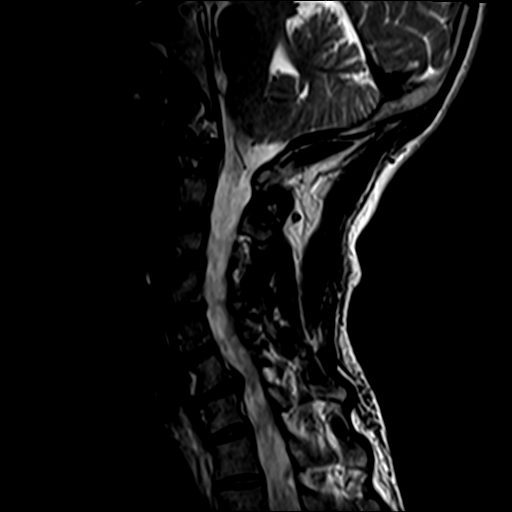
[im 9/17]
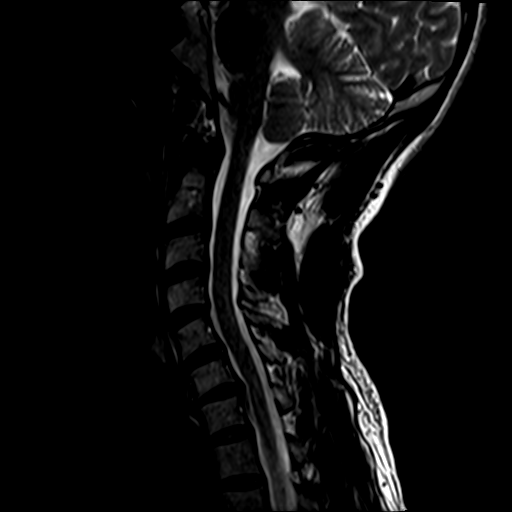
[im 11/17]
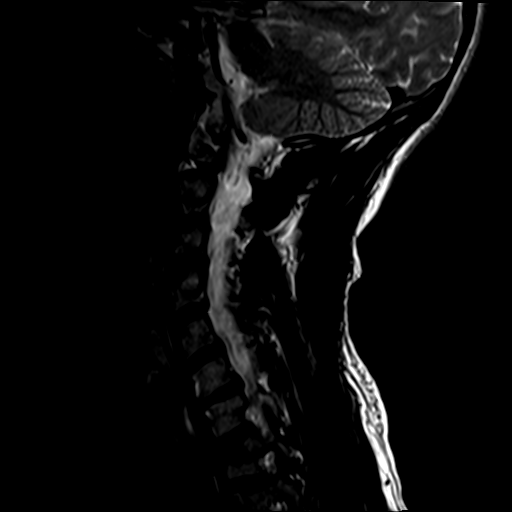
[im 14/17]
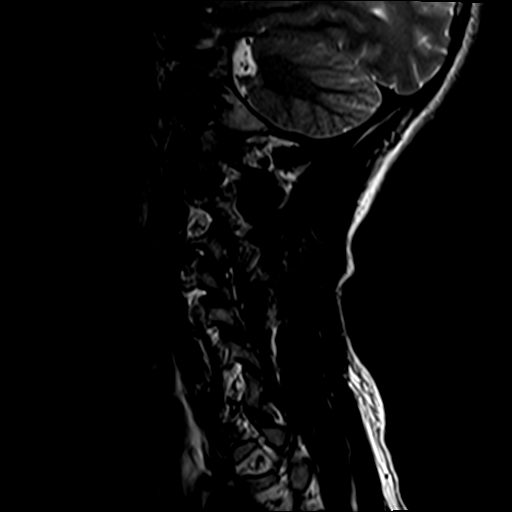
[im 17/17]
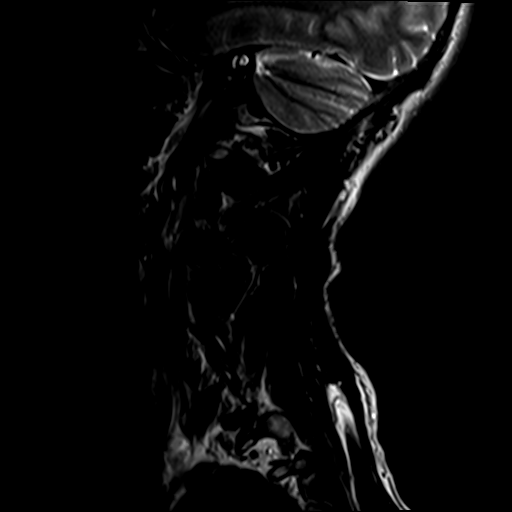

[Series 3: STIR · sagittal · 3.0mm · 0.82mm/px · 7 of 17 slices shown]
[im 1/17]
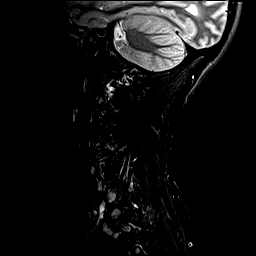
[im 3/17]
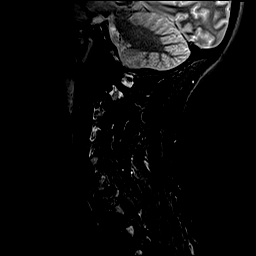
[im 6/17]
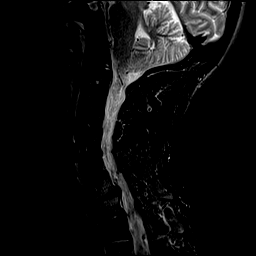
[im 9/17]
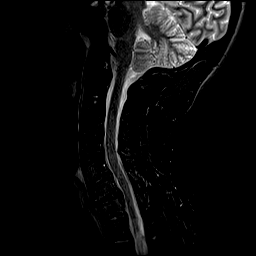
[im 11/17]
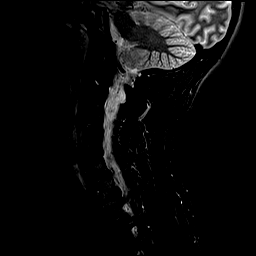
[im 14/17]
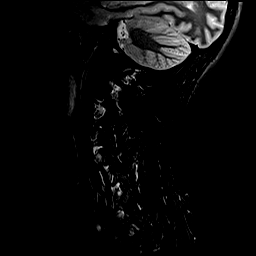
[im 17/17]
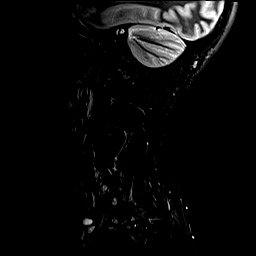

[Series 4: T1 · sagittal · 3.0mm · 0.82mm/px · 8 of 17 slices shown]
[im 1/17]
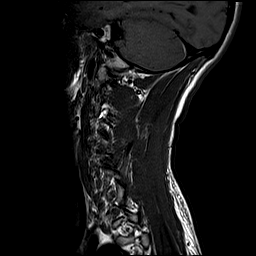
[im 3/17]
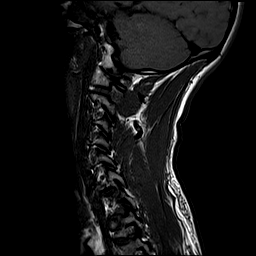
[im 5/17]
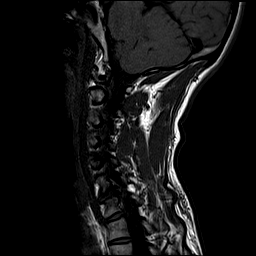
[im 7/17]
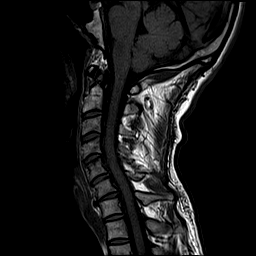
[im 10/17]
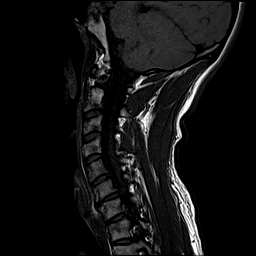
[im 12/17]
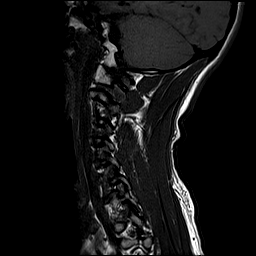
[im 14/17]
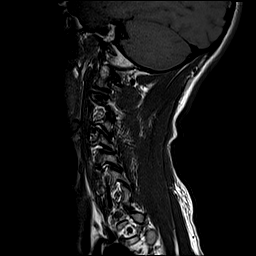
[im 17/17]
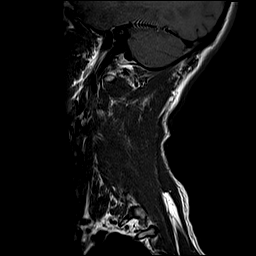

[Series 5: T2 · axial · 3.0mm · 0.70mm/px · z∈[-87,+11]mm · 9 of 28 slices shown (2 of 2)]
[im 1/28]
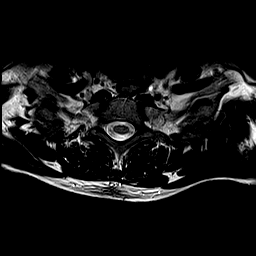
[im 5/28]
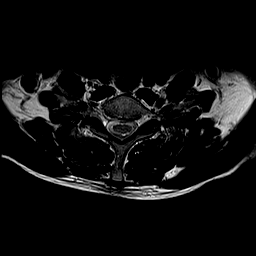
[im 10/28]
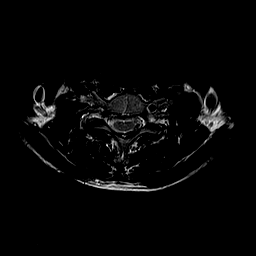
[im 12/28]
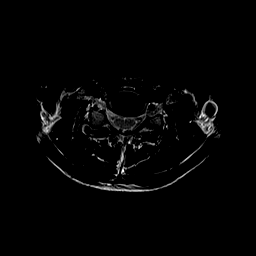
[im 14/28]
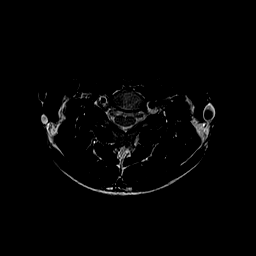
[im 16/28]
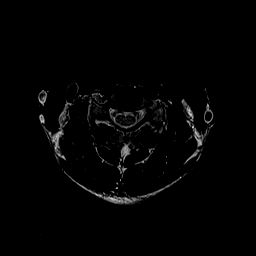
[im 19/28]
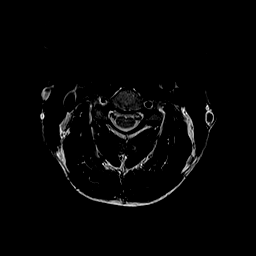
[im 23/28]
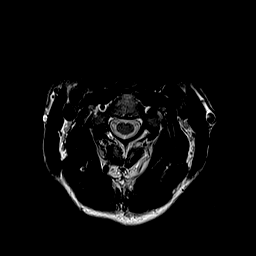
[im 28/28]
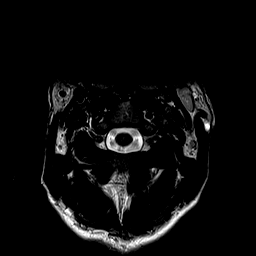

[Series 6: GRE · axial · 3.0mm · 0.35mm/px · z∈[-87,-47]mm · 4 of 28 slices shown]
[im 1/28]
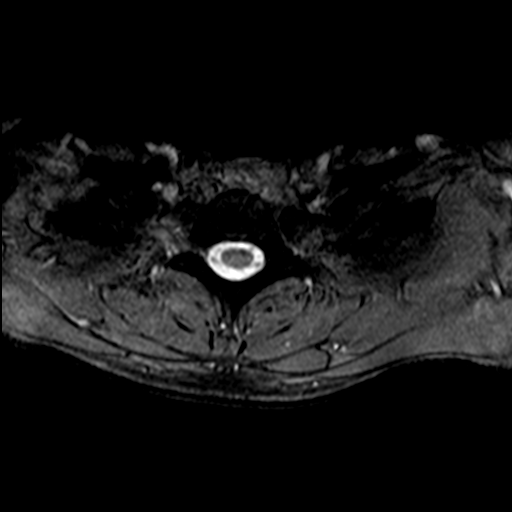
[im 5/28]
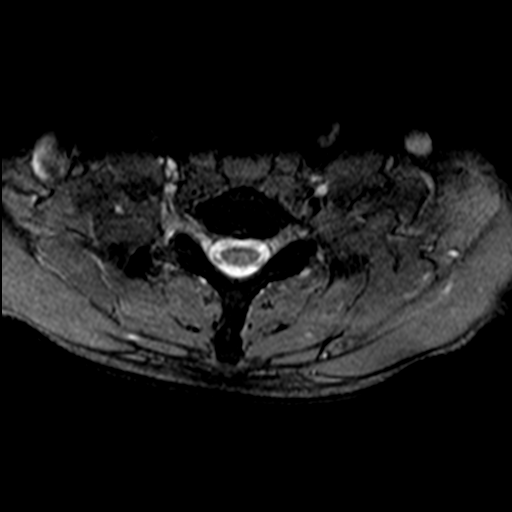
[im 10/28]
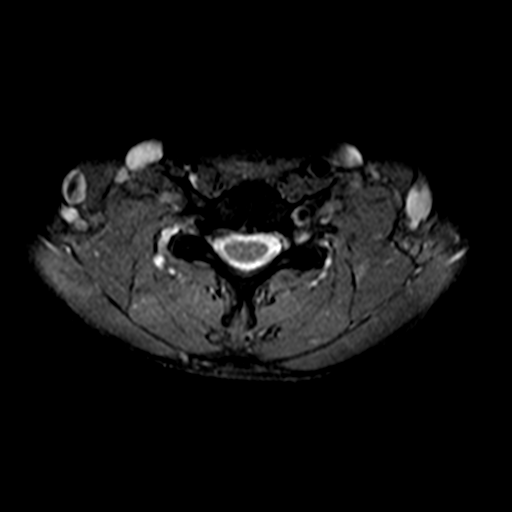
[im 12/28]
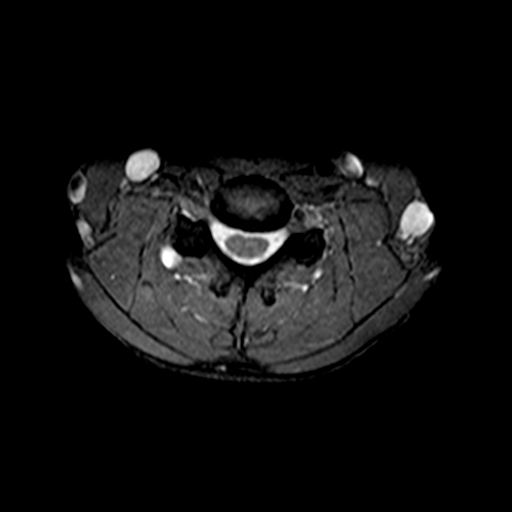

[35 of 48 positions shown; findings below may reference images not displayed]

FINDINGS: Alignment: There is a minimal retrolisthesis of C4 on C5.

Vertebrae: The vertebral body heights are well maintained. No
fracture, marrow edema,or pathologic marrow infiltration.

Cord: Normal signal and morphology.

Posterior Fossa, vertebral arteries, paraspinal tissues:

The visualized portion of the posterior fossa is unremarkable.
Normal flow voids seen within the vertebral arteries. The paraspinal
soft tissues are unremarkable.

Disc levels:

C1-C2: Atlanto-axial junction is normal, without canal narrowing

C2-C3: No significant spinal canal or neural foraminal narrowing

C3-C4: There is a minimal disc osteophyte complex with mild left
neural foraminal narrowing.

C4-C5: There is uncovertebral osteophytes which causes mild right
neural foraminal narrowing.

C5-C6: There is a mild disc osteophyte complex with a small
posterior annular fissure which causes mild effacement anterior
thecal sac and mild bilateral foraminal narrowing.

C6-C7: There is a right foraminal disc protrusion which contacts and
impinges the right C7 nerve root. There is severe right and mild
left neural foraminal narrowing present. Mild central canal stenosis
is present.

C7-T1: No significant spinal canal or neural foraminal narrowing
IMPRESSION: Minimal retrolisthesis of C4 on C5.

Cervical spine spondylosis most notable at C6-C7 with a right
foraminal disc protrusion contacting and impinging the right C7
nerve root. There is also severe right and mild left neural
foraminal narrowing with mild central canal stenosis.

## 2019-12-25 DIAGNOSIS — M5412 Radiculopathy, cervical region: Secondary | ICD-10-CM | POA: Insufficient documentation

## 2020-01-10 ENCOUNTER — Other Ambulatory Visit: Payer: Self-pay | Admitting: Orthopedic Surgery

## 2020-01-10 ENCOUNTER — Encounter: Payer: Self-pay | Admitting: Emergency Medicine

## 2020-01-10 ENCOUNTER — Encounter: Payer: Self-pay | Admitting: Orthopedic Surgery

## 2020-01-10 ENCOUNTER — Ambulatory Visit
Admission: RE | Admit: 2020-01-10 | Discharge: 2020-01-10 | Disposition: A | Discharge status: Home / Self Care | Payer: BC Managed Care – PPO | Source: Ambulatory Visit | Attending: Orthopedic Surgery | Admitting: Orthopedic Surgery

## 2020-01-10 ENCOUNTER — Ambulatory Visit (INDEPENDENT_AMBULATORY_CARE_PROVIDER_SITE_OTHER): Payer: BC Managed Care – PPO

## 2020-01-10 ENCOUNTER — Other Ambulatory Visit: Payer: Self-pay

## 2020-01-10 ENCOUNTER — Ambulatory Visit
Admission: EM | Admit: 2020-01-10 | Discharge: 2020-01-10 | Disposition: A | Discharge status: Home / Self Care | Payer: BC Managed Care – PPO | Attending: Family Medicine | Admitting: Family Medicine

## 2020-01-10 ENCOUNTER — Other Ambulatory Visit
Admission: RE | Admit: 2020-01-10 | Discharge: 2020-01-10 | Disposition: A | Discharge status: Home / Self Care | Payer: BC Managed Care – PPO | Source: Ambulatory Visit

## 2020-01-10 ENCOUNTER — Other Ambulatory Visit (HOSPITAL_COMMUNITY): Payer: Self-pay | Admitting: Orthopedic Surgery

## 2020-01-10 DIAGNOSIS — S52572A Other intraarticular fracture of lower end of left radius, initial encounter for closed fracture: Secondary | ICD-10-CM | POA: Insufficient documentation

## 2020-01-10 DIAGNOSIS — M25532 Pain in left wrist: Secondary | ICD-10-CM | POA: Diagnosis not present

## 2020-01-10 DIAGNOSIS — Z01818 Encounter for other preprocedural examination: Secondary | ICD-10-CM | POA: Diagnosis not present

## 2020-01-10 DIAGNOSIS — Z20822 Contact with and (suspected) exposure to covid-19: Secondary | ICD-10-CM | POA: Diagnosis not present

## 2020-01-10 DIAGNOSIS — X58XXXA Exposure to other specified factors, initial encounter: Secondary | ICD-10-CM | POA: Insufficient documentation

## 2020-01-10 HISTORY — DX: Anxiety disorder, unspecified: F41.9

## 2020-01-10 HISTORY — DX: Other cervical disc displacement, unspecified cervical region: M50.20
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
## 2020-01-10 MED ORDER — FAMOTIDINE 20 MG PO TABS: 20.0000 mg | ORAL_TABLET | Freq: Once | ORAL | Status: AC

## 2020-01-10 MED ORDER — CEFAZOLIN SODIUM-DEXTROSE 2-4 GM/100ML-% IV SOLN
2.0000 g | INTRAVENOUS | Status: AC
  Administered 2020-01-11: 2 g via INTRAVENOUS

## 2020-01-10 MED ORDER — ORAL CARE MOUTH RINSE: 15.0000 mL | Freq: Once | OROMUCOSAL | Status: AC

## 2020-01-10 MED ORDER — KETOROLAC TROMETHAMINE 60 MG/2ML IM SOLN
60.0000 mg | Freq: Once | INTRAMUSCULAR | Status: AC
  Administered 2020-01-10: 60 mg via INTRAMUSCULAR

## 2020-01-10 MED ORDER — LACTATED RINGERS IV SOLN: INTRAVENOUS | Status: DC

## 2020-01-10 MED ORDER — CHLORHEXIDINE GLUCONATE 0.12 % MT SOLN: 15.0000 mL | Freq: Once | OROMUCOSAL | Status: AC

## 2020-01-10 NOTE — Discharge Instructions (Signed)
I have spoke with Dr. Rosita Kea. They are expecting you. Go directly there.  Take care  Dr. [facility] [address] [place], [place] [address]
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
## 2020-01-11 ENCOUNTER — Ambulatory Visit: Payer: BC Managed Care – PPO

## 2020-01-11 ENCOUNTER — Ambulatory Visit: Payer: BC Managed Care – PPO | Admitting: Anesthesiology

## 2020-01-11 ENCOUNTER — Encounter: Payer: Self-pay | Admitting: Orthopedic Surgery

## 2020-01-11 ENCOUNTER — Ambulatory Visit
Admission: RE | Admit: 2020-01-11 | Discharge: 2020-01-11 | Disposition: A | Discharge status: Home / Self Care | Payer: BC Managed Care – PPO | Attending: Orthopedic Surgery | Admitting: Orthopedic Surgery

## 2020-01-11 ENCOUNTER — Other Ambulatory Visit: Payer: Self-pay

## 2020-01-11 ENCOUNTER — Encounter
Admission: RE | Disposition: A | Discharge status: Home / Self Care | Payer: Self-pay | Source: Home / Self Care | Attending: Orthopedic Surgery

## 2020-01-11 DIAGNOSIS — S52572A Other intraarticular fracture of lower end of left radius, initial encounter for closed fracture: Secondary | ICD-10-CM | POA: Diagnosis present

## 2020-01-11 DIAGNOSIS — Z79899 Other long term (current) drug therapy: Secondary | ICD-10-CM | POA: Insufficient documentation

## 2020-01-11 DIAGNOSIS — Z9889 Other specified postprocedural states: Secondary | ICD-10-CM

## 2020-01-11 DIAGNOSIS — Z8781 Personal history of (healed) traumatic fracture: Secondary | ICD-10-CM

## 2020-01-11 HISTORY — PX: ORIF WRIST FRACTURE: SHX2133

## 2020-01-11 HISTORY — DX: Other cervical disc degeneration, unspecified cervical region: M50.30

## 2020-01-11 LAB — BASIC METABOLIC PANEL
Anion gap: 12 (ref 5–15)
BUN: 24 mg/dL — ABNORMAL HIGH (ref 6–20)
CO2: 23 mmol/L (ref 22–32)
Calcium: 9.5 mg/dL (ref 8.9–10.3)
Chloride: 106 mmol/L (ref 98–111)
Creatinine, Ser: 0.76 mg/dL (ref 0.61–1.24)
GFR, Estimated: >60 mL/min (ref >60)
Glucose, Bld: 105 mg/dL — ABNORMAL HIGH (ref 70–99)
Potassium: 4 mmol/L (ref 3.5–5.1)
Sodium: 141 mmol/L (ref 135–145)

## 2020-01-11 LAB — CBC
HCT: 42.7 % (ref 39.0–52.0)
Hemoglobin: 14.6 g/dL (ref 13.0–17.0)
MCH: 30 pg (ref 26.0–34.0)
MCHC: 34.2 g/dL (ref 30.0–36.0)
MCV: 87.9 fL (ref 80.0–100.0)
Platelets: 198 10*3/uL (ref 150–400)
RBC: 4.86 MIL/uL (ref 4.22–5.81)
RDW: 13.3 % (ref 11.5–15.5)
WBC: 14.2 10*3/uL — ABNORMAL HIGH (ref 4.0–10.5)
nRBC: 0 % (ref 0.0–0.2)

## 2020-01-11 LAB — SARS CORONAVIRUS 2 BY RT PCR (HOSPITAL ORDER, PERFORMED IN ~~LOC~~ HOSPITAL LAB): SARS Coronavirus 2: NEGATIVE

## 2020-01-11 IMAGING — DX DG WRIST 2V*L*
2 series · 2 of 2 positions shown · non-contrast
Comparison: [DATE]

CLINICAL DATA: Postop internal fixation

EXAM:
LEFT WRIST - 2 VIEW

[wrist ap]
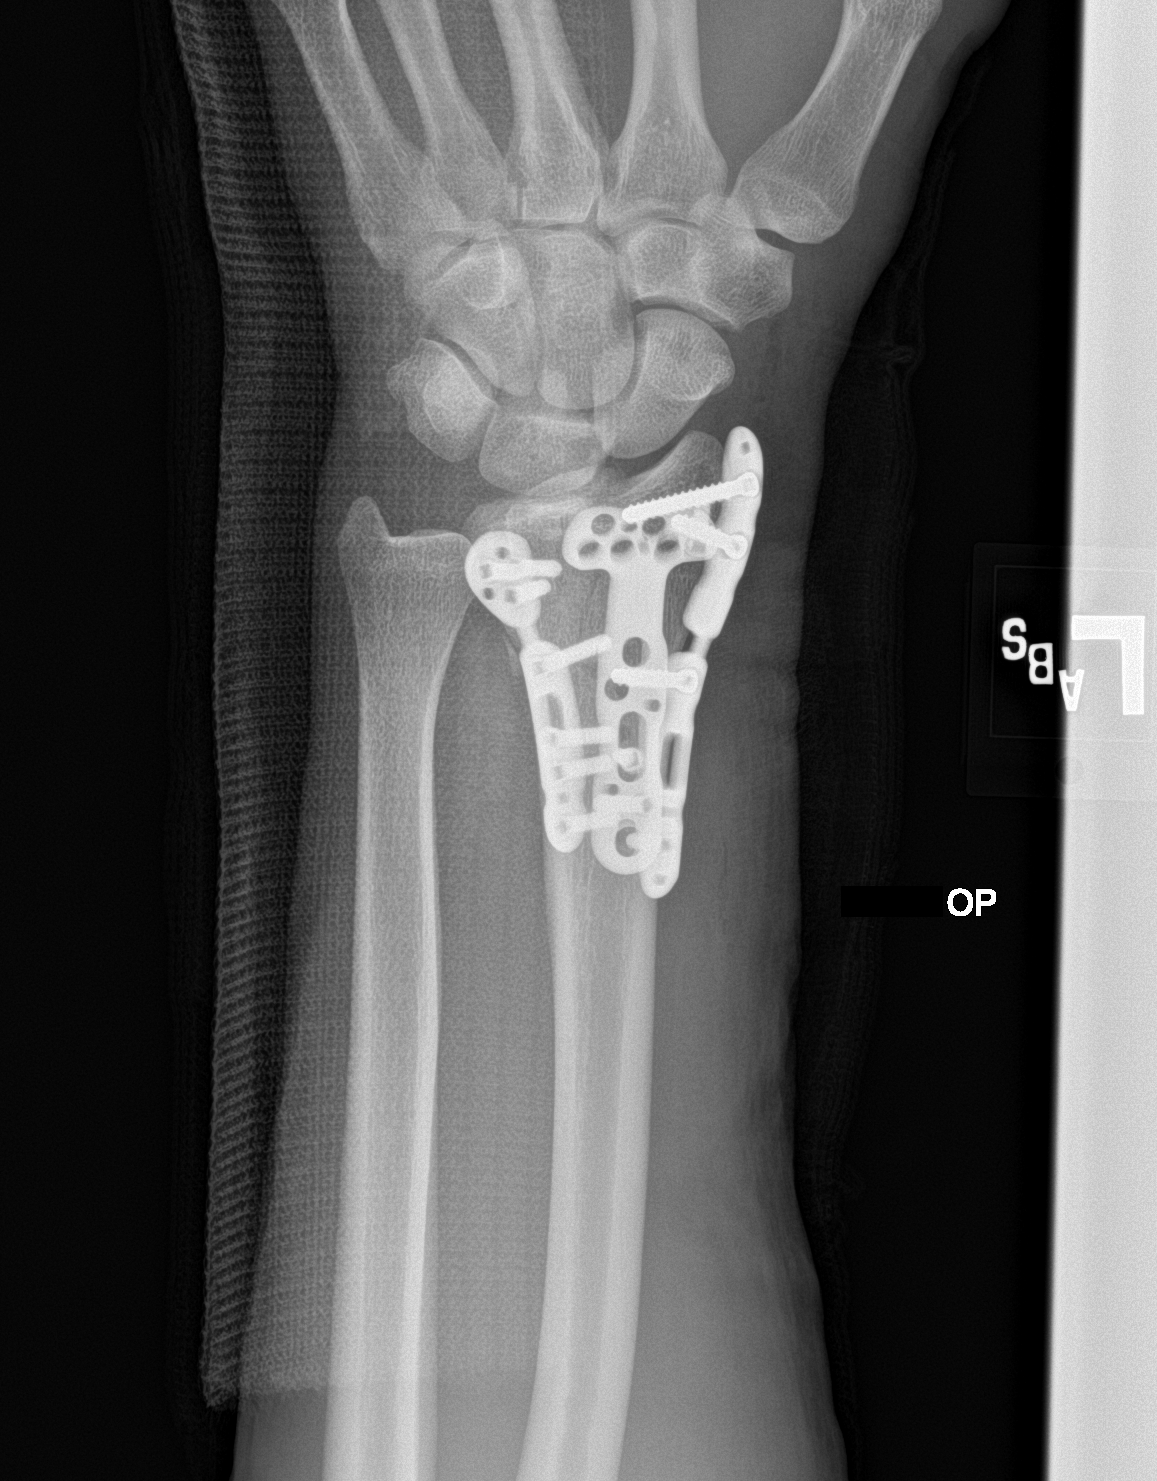

[wrist lat]
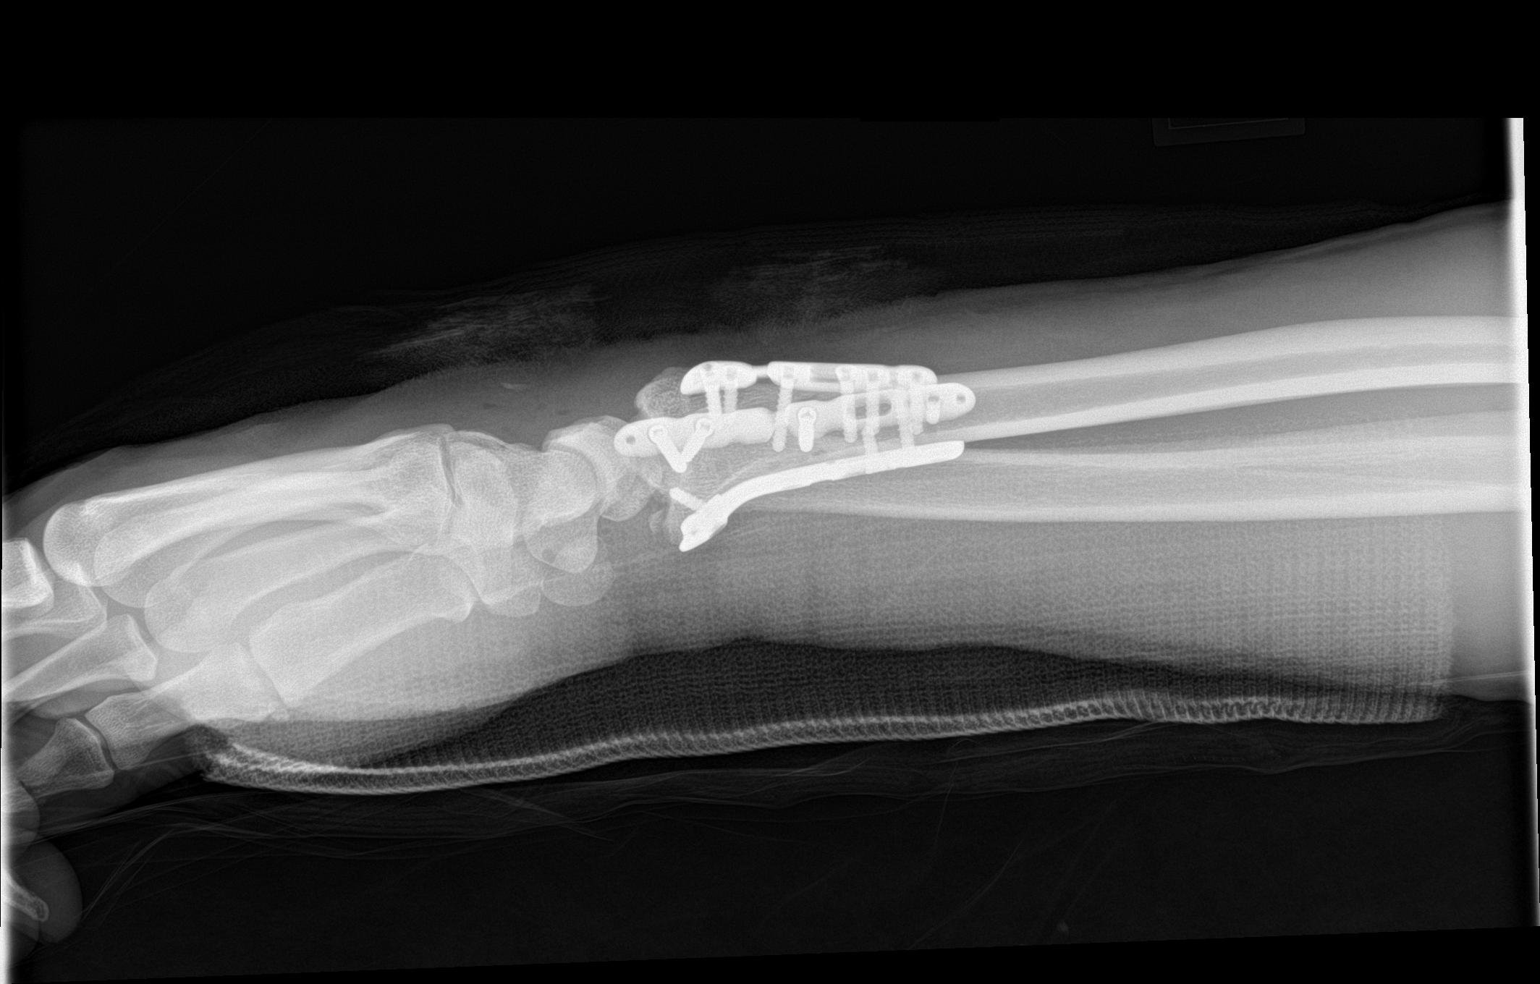

[2 of 2 positions shown; findings below may reference images not displayed]

FINDINGS: Plate and screw fixation noted across the distal left radial
fracture. No hardware complicating feature. Anatomic alignment.
IMPRESSION: Internal fixation.  No visible complicating feature.

## 2020-01-11 SURGERY — OPEN REDUCTION INTERNAL FIXATION (ORIF) WRIST FRACTURE
Anesthesia: General | Site: Wrist | Laterality: Left

## 2020-01-11 MED ORDER — HYDROMORPHONE HCL 1 MG/ML IJ SOLN
INTRAMUSCULAR | Status: AC
  Administered 2020-01-11: 0.5 mg via INTRAVENOUS
  Filled 2020-01-11: qty 1

## 2020-01-11 MED ORDER — HYDROCODONE-ACETAMINOPHEN 5-325 MG PO TABS
ORAL_TABLET | ORAL | Status: AC
  Administered 2020-01-11: 1 via ORAL
  Filled 2020-01-11: qty 1

## 2020-01-11 MED ORDER — ONDANSETRON HCL 4 MG/2ML IJ SOLN: 4.0000 mg | Freq: Once | INTRAMUSCULAR | Status: DC | PRN

## 2020-01-11 MED ORDER — METOCLOPRAMIDE HCL 10 MG PO TABS: 5.0000 mg | ORAL_TABLET | Freq: Three times a day (TID) | ORAL | Status: DC | PRN

## 2020-01-11 MED ORDER — CHLORHEXIDINE GLUCONATE 0.12 % MT SOLN
OROMUCOSAL | Status: AC
  Administered 2020-01-11: 15 mL via OROMUCOSAL
  Filled 2020-01-11: qty 15

## 2020-01-11 MED ORDER — ACETAMINOPHEN 10 MG/ML IV SOLN
INTRAVENOUS | Status: AC
  Filled 2020-01-11: qty 100

## 2020-01-11 MED ORDER — DEXMEDETOMIDINE (PRECEDEX) IN NS 20 MCG/5ML (4 MCG/ML) IV SYRINGE
PREFILLED_SYRINGE | INTRAVENOUS | Status: AC
  Filled 2020-01-11: qty 5

## 2020-01-11 MED ORDER — MIDAZOLAM HCL 2 MG/2ML IJ SOLN
INTRAMUSCULAR | Status: AC
  Filled 2020-01-11: qty 2

## 2020-01-11 MED ORDER — FENTANYL CITRATE (PF) 100 MCG/2ML IJ SOLN
25.0000 ug | INTRAMUSCULAR | Status: DC | PRN
  Administered 2020-01-11 (×3): 25 ug via INTRAVENOUS

## 2020-01-11 MED ORDER — FAMOTIDINE 20 MG PO TABS
ORAL_TABLET | ORAL | Status: AC
  Administered 2020-01-11: 20 mg via ORAL
  Filled 2020-01-11: qty 1

## 2020-01-11 MED ORDER — ONDANSETRON HCL 4 MG/2ML IJ SOLN
INTRAMUSCULAR | Status: AC
  Filled 2020-01-11: qty 2

## 2020-01-11 MED ORDER — FENTANYL CITRATE (PF) 100 MCG/2ML IJ SOLN
INTRAMUSCULAR | Status: AC
  Filled 2020-01-11: qty 2

## 2020-01-11 MED ORDER — MIDAZOLAM HCL 2 MG/2ML IJ SOLN
INTRAMUSCULAR | Status: DC | PRN
  Administered 2020-01-11: 2 mg via INTRAVENOUS

## 2020-01-11 MED ORDER — ONDANSETRON HCL 4 MG/2ML IJ SOLN: 4.0000 mg | Freq: Four times a day (QID) | INTRAMUSCULAR | Status: DC | PRN

## 2020-01-11 MED ORDER — SODIUM CHLORIDE 0.9 % IV SOLN: INTRAVENOUS | Status: DC

## 2020-01-11 MED ORDER — ONDANSETRON HCL 4 MG/2ML IJ SOLN
INTRAMUSCULAR | Status: DC | PRN
  Administered 2020-01-11: 4 mg via INTRAVENOUS

## 2020-01-11 MED ORDER — NEOMYCIN-POLYMYXIN B GU 40-200000 IR SOLN
Status: DC | PRN
  Administered 2020-01-11: 2 mL

## 2020-01-11 MED ORDER — HYDROMORPHONE HCL 1 MG/ML IJ SOLN
0.5000 mg | INTRAMUSCULAR | Status: AC | PRN
  Administered 2020-01-11 (×2): 0.5 mg via INTRAVENOUS

## 2020-01-11 MED ORDER — HYDROMORPHONE HCL 1 MG/ML IJ SOLN
INTRAMUSCULAR | Status: DC | PRN
Start: 2020-01-11 — End: 2020-01-11
  Administered 2020-01-11 (×2): .5 mg via INTRAVENOUS

## 2020-01-11 MED ORDER — OXYCODONE HCL 5 MG PO TABS
ORAL_TABLET | ORAL | Status: AC
  Administered 2020-01-11: 5 mg via ORAL
  Filled 2020-01-11: qty 1

## 2020-01-11 MED ORDER — HYDROCODONE-ACETAMINOPHEN 10-325 MG PO TABS: 1.0000 | ORAL_TABLET | ORAL | 0 refills | Status: DC | PRN

## 2020-01-11 MED ORDER — FENTANYL CITRATE (PF) 100 MCG/2ML IJ SOLN
INTRAMUSCULAR | Status: AC
  Administered 2020-01-11: 25 ug via INTRAVENOUS
  Filled 2020-01-11: qty 2

## 2020-01-11 MED ORDER — ACETAMINOPHEN 10 MG/ML IV SOLN
INTRAVENOUS | Status: DC | PRN
  Administered 2020-01-11: 1000 mg via INTRAVENOUS

## 2020-01-11 MED ORDER — HYDROCODONE-ACETAMINOPHEN 5-325 MG PO TABS: 1.0000 | ORAL_TABLET | Freq: Once | ORAL | Status: AC

## 2020-01-11 MED ORDER — CEFAZOLIN SODIUM-DEXTROSE 2-4 GM/100ML-% IV SOLN
INTRAVENOUS | Status: AC
  Filled 2020-01-11: qty 100

## 2020-01-11 MED ORDER — DEXAMETHASONE SODIUM PHOSPHATE 10 MG/ML IJ SOLN
INTRAMUSCULAR | Status: DC | PRN
  Administered 2020-01-11: 10 mg via INTRAVENOUS

## 2020-01-11 MED ORDER — KETOROLAC TROMETHAMINE 30 MG/ML IJ SOLN
INTRAMUSCULAR | Status: DC | PRN
  Administered 2020-01-11: 30 mg via INTRAVENOUS

## 2020-01-11 MED ORDER — LIDOCAINE HCL (CARDIAC) PF 100 MG/5ML IV SOSY
PREFILLED_SYRINGE | INTRAVENOUS | Status: DC | PRN
  Administered 2020-01-11: 100 mg via INTRAVENOUS

## 2020-01-11 MED ORDER — ONDANSETRON HCL 4 MG PO TABS: 4.0000 mg | ORAL_TABLET | Freq: Four times a day (QID) | ORAL | Status: DC | PRN

## 2020-01-11 MED ORDER — OXYCODONE HCL 5 MG PO TABS: 5.0000 mg | ORAL_TABLET | Freq: Once | ORAL | Status: AC | PRN

## 2020-01-11 MED ORDER — METOCLOPRAMIDE HCL 5 MG/ML IJ SOLN: 5.0000 mg | Freq: Three times a day (TID) | INTRAMUSCULAR | Status: DC | PRN

## 2020-01-11 MED ORDER — DEXMEDETOMIDINE HCL 200 MCG/2ML IV SOLN
INTRAVENOUS | Status: DC | PRN
  Administered 2020-01-11: 4 ug via INTRAVENOUS
  Administered 2020-01-11 (×2): 8 ug via INTRAVENOUS
  Administered 2020-01-11 (×4): 4 ug via INTRAVENOUS

## 2020-01-11 MED ORDER — PROPOFOL 10 MG/ML IV BOLUS
INTRAVENOUS | Status: DC | PRN
  Administered 2020-01-11: 150 mg via INTRAVENOUS

## 2020-01-11 MED ORDER — HYDROMORPHONE HCL 1 MG/ML IJ SOLN
INTRAMUSCULAR | Status: AC
  Filled 2020-01-11: qty 1

## 2020-01-11 MED ORDER — FENTANYL CITRATE (PF) 100 MCG/2ML IJ SOLN
INTRAMUSCULAR | Status: DC | PRN
  Administered 2020-01-11: 25 ug via INTRAVENOUS
  Administered 2020-01-11: 50 ug via INTRAVENOUS
  Administered 2020-01-11 (×5): 25 ug via INTRAVENOUS

## 2020-01-11 MED ORDER — ONDANSETRON HCL 4 MG/2ML IJ SOLN
4.0000 mg | Freq: Once | INTRAMUSCULAR | Status: AC
  Administered 2020-01-11: 4 mg via INTRAVENOUS

## 2020-01-11 SURGICAL SUPPLY — 63 items
APL PRP STRL LF DISP 70% ISPRP (MISCELLANEOUS) ×1
BIT DRILL 2.2 SS TIBIAL (BIT) ×6 IMPLANT
BIT DRILL 2.9 (BIT) ×1
BIT DRILL DVR 2.9 (BIT) ×1 IMPLANT
BNDG CMPR STD VLCR NS LF 5.8X4 (GAUZE/BANDAGES/DRESSINGS) ×1
BNDG ELASTIC 4X5.8 VLCR NS LF (GAUZE/BANDAGES/DRESSINGS) ×3 IMPLANT
BNDG ELASTIC 4X5.8 VLCR STR LF (GAUZE/BANDAGES/DRESSINGS) ×3 IMPLANT
CANISTER SUCT 1200ML W/VALVE (MISCELLANEOUS) ×3 IMPLANT
CHLORAPREP W/TINT 26 (MISCELLANEOUS) ×3 IMPLANT
COVER WAND RF STERILE (DRAPES) ×3 IMPLANT
CUFF TOURN SGL QUICK 18X4 (TOURNIQUET CUFF) IMPLANT
DRAPE FLUOR MINI C-ARM 54X84 (DRAPES) ×3 IMPLANT
DRILL BIT 2.9MM (BIT) ×3
ELECT CAUTERY BLADE 6.4 (BLADE) ×3 IMPLANT
ELECT REM PT RETURN 9FT ADLT (ELECTROSURGICAL) ×3
ELECTRODE REM PT RTRN 9FT ADLT (ELECTROSURGICAL) ×1 IMPLANT
GAUZE SPONGE 4X4 12PLY STRL (GAUZE/BANDAGES/DRESSINGS) ×3 IMPLANT
GAUZE XEROFORM 1X8 LF (GAUZE/BANDAGES/DRESSINGS) ×3 IMPLANT
GLOVE SURG SYN 9.0  PF PI (GLOVE) ×2
GLOVE SURG SYN 9.0 PF PI (GLOVE) ×1 IMPLANT
GOWN SRG 2XL LVL 4 RGLN SLV (GOWNS) ×1 IMPLANT
GOWN STRL NON-REIN 2XL LVL4 (GOWNS) ×3
GOWN STRL REUS W/ TWL LRG LVL3 (GOWN DISPOSABLE) ×1 IMPLANT
GOWN STRL REUS W/TWL LRG LVL3 (GOWN DISPOSABLE) ×3
K-WIRE 1.6 (WIRE) ×12
K-WIRE FX5X1.6XNS BN SS (WIRE) ×4
KIT TURNOVER KIT A (KITS) ×3 IMPLANT
KWIRE FX5X1.6XNS BN SS (WIRE) ×4 IMPLANT
MANIFOLD NEPTUNE II (INSTRUMENTS) ×3 IMPLANT
NEEDLE FILTER BLUNT 18X 1/2SAF (NEEDLE) ×2
NEEDLE FILTER BLUNT 18X1 1/2 (NEEDLE) ×1 IMPLANT
NS IRRIG 500ML POUR BTL (IV SOLUTION) ×3 IMPLANT
PACK EXTREMITY (MISCELLANEOUS) ×3 IMPLANT
PAD CAST CTTN 4X4 STRL (SOFTGOODS) ×1 IMPLANT
PADDING CAST COTTON 4X4 STRL (SOFTGOODS) ×3
PLATE DVR DORSAL L CROSSLOCK (Plate) ×3 IMPLANT
PLATE DVR RADIUS L CROSSLOCK (Plate) ×3 IMPLANT
PLATE NARROW DVR LEFT (Plate) ×3 IMPLANT
SCALPEL PROTECTED #15 DISP (BLADE) ×9 IMPLANT
SCREW  LP NL 2.7X10MM (Screw) ×2 IMPLANT
SCREW  LP NL 2.7X16MM (Screw) ×4 IMPLANT
SCREW 2.7X12MM (Screw) ×6 IMPLANT
SCREW 2.7X14MM (Screw) ×2 IMPLANT
SCREW BN 14X2.7XNONLOCK 3 LD (Screw) ×1 IMPLANT
SCREW LOCK 10X2.7X3 LD THRD (Screw) ×3 IMPLANT
SCREW LOCK 14X2.7X 3 LD TPR (Screw) ×1 IMPLANT
SCREW LOCKING 2.7X10MM (Screw) ×9 IMPLANT
SCREW LOCKING 2.7X14 (Screw) ×3 IMPLANT
SCREW LOCKING 2.7X8MM (Screw) ×9 IMPLANT
SCREW LP NL 2.7X10MM (Screw) ×1 IMPLANT
SCREW LP NL 2.7X16MM (Screw) ×2 IMPLANT
SCREW MULTI DIRECTIONAL 2.7X12 (Screw) ×3 IMPLANT
SCREW MULTI DIRECTIONAL 2.7X13 (Screw) ×3 IMPLANT
SCREW MULTI DIRECTIONAL 2.7X20 (Screw) ×3 IMPLANT
SCREW MULTI DIRECTIONAL 2.7X30 (Screw) ×3 IMPLANT
SCREW NLOCK 2.7X14 (Screw) ×1 IMPLANT
SCREW NONLOCK 2.7X20MM (Screw) ×3 IMPLANT
SPLINT CAST 1 STEP 3X12 (MISCELLANEOUS) ×3 IMPLANT
SUT ETHILON 4-0 (SUTURE) ×9
SUT ETHILON 4-0 FS2 18XMFL BLK (SUTURE) ×3
SUT VICRYL 3-0 27IN (SUTURE) ×6 IMPLANT
SUTURE ETHLN 4-0 FS2 18XMF BLK (SUTURE) ×3 IMPLANT
SYR 3ML LL SCALE MARK (SYRINGE) ×3 IMPLANT

## 2020-01-11 NOTE — Transfer of Care (Signed)
Immediate Anesthesia Transfer of Care Note  Patient: James Boyer  Procedure(s) Performed: OPEN REDUCTION INTERNAL FIXATION (ORIF) WRIST FRACTURE (Left Wrist)  Patient Location: PACU  Anesthesia Type:General  Level of Consciousness: sedated  Airway & Oxygen Therapy: Patient connected to face mask oxygen  Post-op Assessment: Post -op Vital signs reviewed and stable  Post vital signs: Reviewed and stable  Last Vitals:  Vitals Value Taken Time  BP 133/91 01/11/20 1539  Temp 36.2 C 01/11/20 1538  Pulse 93 01/11/20 1541  Resp 21 01/11/20 1541  SpO2 100 % 01/11/20 1541  Vitals shown include unvalidated device data.  Last Pain:  Vitals:   01/11/20 1045  PainSc: 8          Complications: No complications documented.

## 2020-01-11 NOTE — Anesthesia Procedure Notes (Signed)
Procedure Name: LMA Insertion Date/Time: 01/11/2020 12:38 PM Performed by: Joanette Gula, Reylynn Vanalstine, CRNA Pre-anesthesia Checklist: Patient identified, Emergency Drugs available, Suction available and Patient being monitored Patient Re-evaluated:Patient Re-evaluated prior to induction Oxygen Delivery Method: Circle system utilized Preoxygenation: Pre-oxygenation with 100% oxygen Induction Type: IV induction Ventilation: Mask ventilation without difficulty LMA: LMA inserted LMA Size: 4.0 Number of attempts: 1 Placement Confirmation: positive ETCO2 and breath sounds checked- equal and bilateral Tube secured with: Tape Dental Injury: Teeth and Oropharynx as per pre-operative assessment

## 2020-01-11 NOTE — Anesthesia Preprocedure Evaluation (Addendum)
Anesthesia Evaluation  Patient identified by MRN, date of birth, ID band Patient awake    Reviewed: Allergy & Precautions, NPO status , Patient's Chart, lab work & pertinent test results  History of Anesthesia Complications Negative for: history of anesthetic complications  Airway Mallampati: II       Dental   Pulmonary neg sleep apnea, neg COPD, Current Smoker,           Cardiovascular (-) hypertension(-) Past MI and (-) CHF (-) dysrhythmias (-) Valvular Problems/Murmurs     Neuro/Psych neg Seizures Anxiety    GI/Hepatic Neg liver ROS, neg GERD  ,  Endo/Other  neg diabetes  Renal/GU negative Renal ROS     Musculoskeletal   Abdominal   Peds  Hematology   Anesthesia Other Findings   Reproductive/Obstetrics                            Anesthesia Physical Anesthesia Plan  ASA: II  Anesthesia Plan: General   Post-op Pain Management:    Induction: Intravenous  PONV Risk Score and Plan: 1 and Ondansetron  Airway Management Planned: LMA  Additional Equipment:   Intra-op Plan:   Post-operative Plan:   Informed Consent: I have reviewed the patients History and Physical, chart, labs and discussed the procedure including the risks, benefits and alternatives for the proposed anesthesia with the patient or authorized representative who has indicated his/her understanding and acceptance.       Plan Discussed with:   Anesthesia Plan Comments:         Anesthesia Quick Evaluation

## 2020-01-11 NOTE — Anesthesia Postprocedure Evaluation (Signed)
Anesthesia Post Note  Patient: Saafir Abdullah  Procedure(s) Performed: OPEN REDUCTION INTERNAL FIXATION (ORIF) WRIST FRACTURE (Left Wrist)  Patient location during evaluation: PACU Anesthesia Type: General Level of consciousness: awake and alert Pain management: pain level controlled Vital Signs Assessment: post-procedure vital signs reviewed and stable Respiratory status: spontaneous breathing, nonlabored ventilation, respiratory function stable and patient connected to nasal cannula oxygen Cardiovascular status: blood pressure returned to baseline and stable Postop Assessment: no apparent nausea or vomiting Anesthetic complications: no   No complications documented.   Last Vitals:  Vitals:   01/11/20 1623 01/11/20 1638  BP: (!) 127/97 131/89  Pulse: (!) 109 (!) 101  Resp: 14 12  Temp:    SpO2: 96% 96%    Last Pain:  Vitals:   01/11/20 1638  PainSc: 10-Worst pain ever                 Yevette Edwards

## 2020-01-11 NOTE — Discharge Instructions (Signed)
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
## 2020-01-11 NOTE — Op Note (Signed)
01/11/2020  3:56 PM  PATIENT:  James Boyer  39 y.o. male  PRE-OPERATIVE DIAGNOSIS:  LEFT DISTAL RADIUS FRACTURE comminuted intra-articular  POST-OPERATIVE DIAGNOSIS:  LEFT DISTAL RADIUS FRACTURE same  PROCEDURE:  Procedure(s): OPEN REDUCTION INTERNAL FIXATION (ORIF) WRIST FRACTURE (Left)  SURGEON: Leitha Schuller, MD  ASSISTANTS: None  ANESTHESIA:   general  EBL:  Total I/O In: 1100 [I.V.:1000; IV Piggyback:100] Out: -   BLOOD ADMINISTERED:none  DRAINS: none   LOCAL MEDICATIONS USED:  NONE  SPECIMEN:  No Specimen  DISPOSITION OF SPECIMEN:  N/A  COUNTS:  YES  TOURNIQUET:   Total Tourniquet Time Documented: Upper Arm (Left) - 109 minutes Total: Upper Arm (Left) - 109 minutes   IMPLANTS: Biomet cross lock dorsal and radial styloid plates as well as volar cross lock DVR plate with multiple screws  DICTATION: .Dragon Dictation patient was brought to the operating room and after adequate general anesthesia was obtained left arm prepped and draped you sterile fashion.  After patient identification timeout procedure were completed fingertrap traction applied off the end of the table and 10 pounds of traction applied.  Dorsal approach was carried out first with the extensor retinaculum incised EPL protected and released around Lister's tubercle remaining extensor retinaculum was elevated and extensor tendons released for subsequent exposure of the dorsal of the distal radius.  With traction applied the dorsal ulnar fragment could be exposed it was quite shortened and with traction and a Therapist, nutritional could be brought down to a nearly anatomic position.  When this was performed a dorsal locking plate was applied with all nonlocking screws proximally that brought the plate down and brought the fragment into anatomic position.  The distal screw holes were filled with locking screws to aid in rigidity.  The radial styloid fragment was quite large and appeared to be an intra-articular  fragment that with a dorsal capsulotomy could not be removed from the dorsal incision.  With the the radial styloid plate was initially applied but inadequate reduction was obtained so volar approach was carried out with a volar locking plate from the same set placed with a pin holding the styloid.  Going back dorsally that still was an adequately reduced and so removing that screw the plate was held with a clamp dorsally and the locking screw placed volarly this helped hold the styloid in appropriate position with a clamp up still applied subsequently with right brachial radialis released the radial styloid plate was applied and 2 variable angle screws were placed proximally nonlocking screws on the proximal plate to hold the plate in position and this gave good rigidity to the styloid fragment.  When the volar plate was applied the small loose fragments off the volar rim were excised.  The wounds were thoroughly irrigated and under fluoroscopic guidance views the fracture was stable.  The wounds were closed with 3-0 Vicryl subcutaneously and 4-0 nylon for the skin followed by Xeroform 4 x 4's web roll volar splint and Ace wrap tourniquet was let down at 108 and not raised again not required during the remainder of the case.  PLAN OF CARE: Discharge to home after PACU  PATIENT DISPOSITION:  PACU - hemodynamically stable.
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
## 2020-01-12 ENCOUNTER — Encounter: Payer: Self-pay | Admitting: Orthopedic Surgery

## 2020-01-12 ENCOUNTER — Other Ambulatory Visit: Payer: Self-pay | Admitting: Neurosurgery

## 2020-01-15 ENCOUNTER — Encounter
Admission: RE | Admit: 2020-01-15 | Discharge: 2020-01-15 | Disposition: A | Discharge status: Home / Self Care | Payer: BC Managed Care – PPO | Source: Ambulatory Visit | Attending: Neurosurgery | Admitting: Neurosurgery

## 2020-01-15 ENCOUNTER — Other Ambulatory Visit: Payer: Self-pay

## 2020-01-15 DIAGNOSIS — Z01812 Encounter for preprocedural laboratory examination: Secondary | ICD-10-CM | POA: Diagnosis present

## 2020-01-15 NOTE — Patient Instructions (Addendum)
Your procedure is scheduled on:01-19-20 FRIDAY Report to the Registration Desk on the 1st floor of the [facility]. To find out your arrival time, please call [phone] between 1PM - 3PM on:01-17-20 WEDNESDAY  REMEMBER: Instructions that are not followed completely may result in serious medical risk, up to and including death; or upon the discretion of your surgeon and anesthesiologist your surgery may need to be rescheduled.  Do not eat food after midnight the night before surgery.  No gum chewing, lozengers or hard candies.  You may however, drink CLEAR liquids up to 2 hours before you are scheduled to arrive for your surgery. Do not drink anything within 2 hours of your scheduled arrival time.  Clear liquids include: - water  - apple juice without pulp - gatorade (not RED, PURPLE, OR BLUE) - black coffee or tea (Do NOT add milk or creamers to the coffee or tea) Do NOT drink anything that is not on this list.   TAKE THESE MEDICATIONS THE MORNING OF SURGERY WITH A SIP OF WATER:  -YOU MAY TAKE HYDROCODONE (NORCO) IF NEEDED THE MORNING OF YOUR SURGERY  One week prior to surgery: Stop Anti-inflammatories (NSAIDS) such as Advil, Aleve, Ibuprofen, Motrin, Naproxen, Naprosyn and Aspirin based products such as Excedrin, Goodys Powder, BC Powder-OK TO TAKE TYLENOL IF NEEDED  Stop ANY OVER THE COUNTER supplements until after surgery.  No Alcohol for 24 hours before or after surgery.  No Smoking including e-cigarettes for 24 hours prior to surgery.  No chewable tobacco products for at least 6 hours prior to surgery.  No nicotine patches on the day of surgery.  Do not use any "recreational" drugs for at least a week prior to your surgery.  Please be advised that the combination of cocaine and anesthesia may have negative outcomes, up to and including death. If you test positive for cocaine, your surgery will be cancelled.  On the morning of surgery brush your teeth with toothpaste  and water, you may rinse your mouth with mouthwash if you wish. Do not swallow any toothpaste or mouthwash.  Do not wear jewelry, make-up, hairpins, clips or nail polish.  Do not wear lotions, powders, or perfumes.   Do not shave body from the neck down 48 hours prior to surgery just in case you cut yourself which could leave a site for infection.  Also, freshly shaved skin may become irritated if using the CHG soap.  Contact lenses, hearing aids and dentures may not be worn into surgery.  Do not bring valuables to the hospital. [facility] is not responsible for any missing/lost belongings or valuables.   Use CHG WIPES as directed on instruction sheet.  Notify your doctor if there is any change in your medical condition (cold, fever, infection).  Wear comfortable clothing (specific to your surgery type) to the hospital.  Plan for stool softeners for home use; pain medications have a tendency to cause constipation. You can also help prevent constipation by eating foods high in fiber such as fruits and vegetables and drinking plenty of fluids as your diet allows.  After surgery, you can help prevent lung complications by doing breathing exercises.  Take deep breaths and cough every 1-2 hours. Your doctor may order a device called an Incentive Spirometer to help you take deep breaths. When coughing or sneezing, hold a pillow firmly against your incision with both hands. This is called "splinting." Doing this helps protect your incision. It also decreases belly discomfort.  If you are  being admitted to the hospital overnight, leave your suitcase in the car. After surgery it may be brought to your room.  If you are being discharged the day of surgery, you will not be allowed to drive home. You will need a responsible adult (18 years or older) to drive you home and stay with you that night.   If you are taking public transportation, you will need to have a responsible adult (18 years or  older) with you. Please confirm with your physician that it is acceptable to use public transportation.   Please call the Pre-admissions Testing Dept. at [phone] if you have any questions about these instructions.  Visitation Policy:  Patients undergoing a surgery or procedure may have one family member or support person with them as long as that person is not COVID-19 positive or experiencing its symptoms.  That person may remain in the waiting area during the procedure.  Inpatient Visitation Update:   In an effort to ensure the safety of our team members and our patients, we are implementing a change to our visitation policy:  Effective Monday, Aug. 9, at 7 a.m., inpatients will be allowed one support person.  o The support person may change daily.  o The support person must pass our screening, gel in and out, and wear a mask at all times, including in the patient's room.  o Patients must also wear a mask when staff or their support person are in the room.  o Masking is required regardless of vaccination status.  Systemwide, no visitors 17 or younger.

## 2020-01-17 ENCOUNTER — Other Ambulatory Visit: Payer: Self-pay

## 2020-01-17 ENCOUNTER — Encounter
Admission: RE | Admit: 2020-01-17 | Discharge: 2020-01-17 | Disposition: A | Discharge status: Home / Self Care | Payer: BC Managed Care – PPO | Source: Ambulatory Visit | Attending: Neurosurgery | Admitting: Neurosurgery

## 2020-01-17 ENCOUNTER — Other Ambulatory Visit: Payer: BC Managed Care – PPO

## 2020-01-17 DIAGNOSIS — F17200 Nicotine dependence, unspecified, uncomplicated: Secondary | ICD-10-CM | POA: Diagnosis not present

## 2020-01-17 DIAGNOSIS — Z820 Family history of epilepsy and other diseases of the nervous system: Secondary | ICD-10-CM | POA: Diagnosis not present

## 2020-01-17 DIAGNOSIS — Z801 Family history of malignant neoplasm of trachea, bronchus and lung: Secondary | ICD-10-CM | POA: Diagnosis not present

## 2020-01-17 DIAGNOSIS — Z79899 Other long term (current) drug therapy: Secondary | ICD-10-CM | POA: Diagnosis not present

## 2020-01-17 DIAGNOSIS — Z01812 Encounter for preprocedural laboratory examination: Secondary | ICD-10-CM | POA: Insufficient documentation

## 2020-01-17 DIAGNOSIS — M4802 Spinal stenosis, cervical region: Secondary | ICD-10-CM | POA: Diagnosis not present

## 2020-01-17 DIAGNOSIS — M50123 Cervical disc disorder at C6-C7 level with radiculopathy: Secondary | ICD-10-CM | POA: Diagnosis not present

## 2020-01-17 DIAGNOSIS — Z20822 Contact with and (suspected) exposure to covid-19: Secondary | ICD-10-CM | POA: Diagnosis not present

## 2020-01-17 DIAGNOSIS — Z8249 Family history of ischemic heart disease and other diseases of the circulatory system: Secondary | ICD-10-CM | POA: Diagnosis not present

## 2020-01-17 DIAGNOSIS — Z841 Family history of disorders of kidney and ureter: Secondary | ICD-10-CM | POA: Diagnosis not present

## 2020-01-17 DIAGNOSIS — M5412 Radiculopathy, cervical region: Secondary | ICD-10-CM | POA: Diagnosis present

## 2020-01-17 LAB — URINALYSIS, ROUTINE W REFLEX MICROSCOPIC
Bilirubin Urine: NEGATIVE
Glucose, UA: NEGATIVE mg/dL
Hgb urine dipstick: NEGATIVE
Ketones, ur: NEGATIVE mg/dL
Leukocytes,Ua: NEGATIVE
Nitrite: NEGATIVE
Protein, ur: NEGATIVE mg/dL
Specific Gravity, Urine: 1.024 (ref 1.005–1.030)
pH: 6 (ref 5.0–8.0)

## 2020-01-17 LAB — PROTIME-INR
INR: 0.9 (ref 0.8–1.2)
Prothrombin Time: 11.4 seconds (ref 11.4–15.2)

## 2020-01-17 LAB — TYPE AND SCREEN
ABO/RH(D): A POS
Antibody Screen: NEGATIVE

## 2020-01-17 LAB — SURGICAL PCR SCREEN
MRSA, PCR: NEGATIVE
Staphylococcus aureus: POSITIVE — AB

## 2020-01-17 LAB — SARS CORONAVIRUS 2 (TAT 6-24 HRS): SARS Coronavirus 2: NEGATIVE

## 2020-01-17 LAB — APTT: aPTT: 29 seconds (ref 24–36)

## 2020-01-19 ENCOUNTER — Ambulatory Visit: Payer: BC Managed Care – PPO

## 2020-01-19 ENCOUNTER — Encounter: Payer: Self-pay | Admitting: Neurosurgery

## 2020-01-19 ENCOUNTER — Other Ambulatory Visit: Payer: Self-pay

## 2020-01-19 ENCOUNTER — Ambulatory Visit: Payer: BC Managed Care – PPO | Admitting: Registered Nurse

## 2020-01-19 ENCOUNTER — Ambulatory Visit
Admission: RE | Admit: 2020-01-19 | Discharge: 2020-01-19 | Disposition: A | Discharge status: Home / Self Care | Payer: BC Managed Care – PPO | Attending: Neurosurgery | Admitting: Neurosurgery

## 2020-01-19 ENCOUNTER — Encounter
Admission: RE | Disposition: A | Discharge status: Home / Self Care | Payer: Self-pay | Source: Home / Self Care | Attending: Neurosurgery

## 2020-01-19 DIAGNOSIS — Z801 Family history of malignant neoplasm of trachea, bronchus and lung: Secondary | ICD-10-CM | POA: Insufficient documentation

## 2020-01-19 DIAGNOSIS — Z820 Family history of epilepsy and other diseases of the nervous system: Secondary | ICD-10-CM | POA: Insufficient documentation

## 2020-01-19 DIAGNOSIS — M4802 Spinal stenosis, cervical region: Secondary | ICD-10-CM | POA: Diagnosis not present

## 2020-01-19 DIAGNOSIS — Z419 Encounter for procedure for purposes other than remedying health state, unspecified: Secondary | ICD-10-CM

## 2020-01-19 DIAGNOSIS — Z79899 Other long term (current) drug therapy: Secondary | ICD-10-CM | POA: Insufficient documentation

## 2020-01-19 DIAGNOSIS — Z841 Family history of disorders of kidney and ureter: Secondary | ICD-10-CM | POA: Insufficient documentation

## 2020-01-19 DIAGNOSIS — F17200 Nicotine dependence, unspecified, uncomplicated: Secondary | ICD-10-CM | POA: Insufficient documentation

## 2020-01-19 DIAGNOSIS — Z981 Arthrodesis status: Secondary | ICD-10-CM

## 2020-01-19 DIAGNOSIS — Z20822 Contact with and (suspected) exposure to covid-19: Secondary | ICD-10-CM | POA: Insufficient documentation

## 2020-01-19 DIAGNOSIS — Z8249 Family history of ischemic heart disease and other diseases of the circulatory system: Secondary | ICD-10-CM | POA: Insufficient documentation

## 2020-01-19 DIAGNOSIS — M50123 Cervical disc disorder at C6-C7 level with radiculopathy: Secondary | ICD-10-CM | POA: Insufficient documentation

## 2020-01-19 HISTORY — PX: ANTERIOR CERVICAL DECOMP/DISCECTOMY FUSION: SHX1161

## 2020-01-19 LAB — ABO/RH: ABO/RH(D): A POS

## 2020-01-19 IMAGING — RF DG CERVICAL SPINE 2 OR 3 VIEWS
1 series · 3 of 3 positions shown · non-contrast
Comparison: Cervical spine MRI [DATE].

CLINICAL DATA: Surgery, elective. Anterior cervical decompression.
Provided fluoroscopy time 5 seconds (a 0.89 mGy).

EXAM:
CERVICAL SPINE - 2-3 VIEW; DG C-ARM 1-60 MIN

[Series 1: dg x-ray · 0.20mm/px · 3 of 3 slices shown]
[im 1/3]
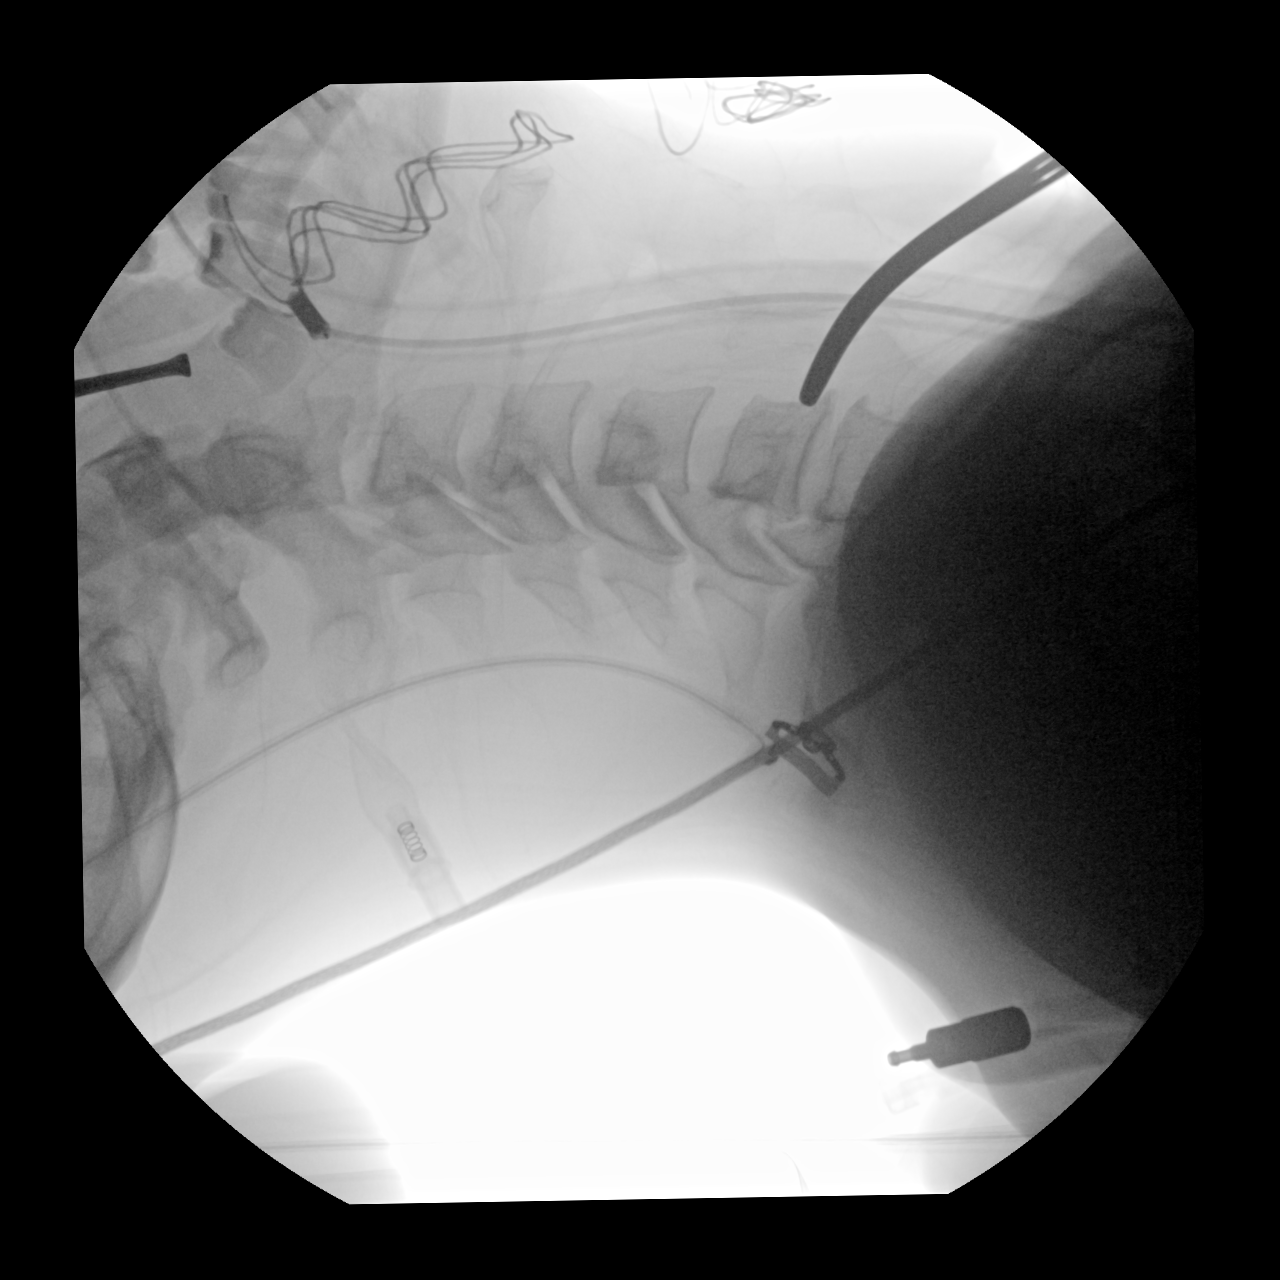
[im 2/3]
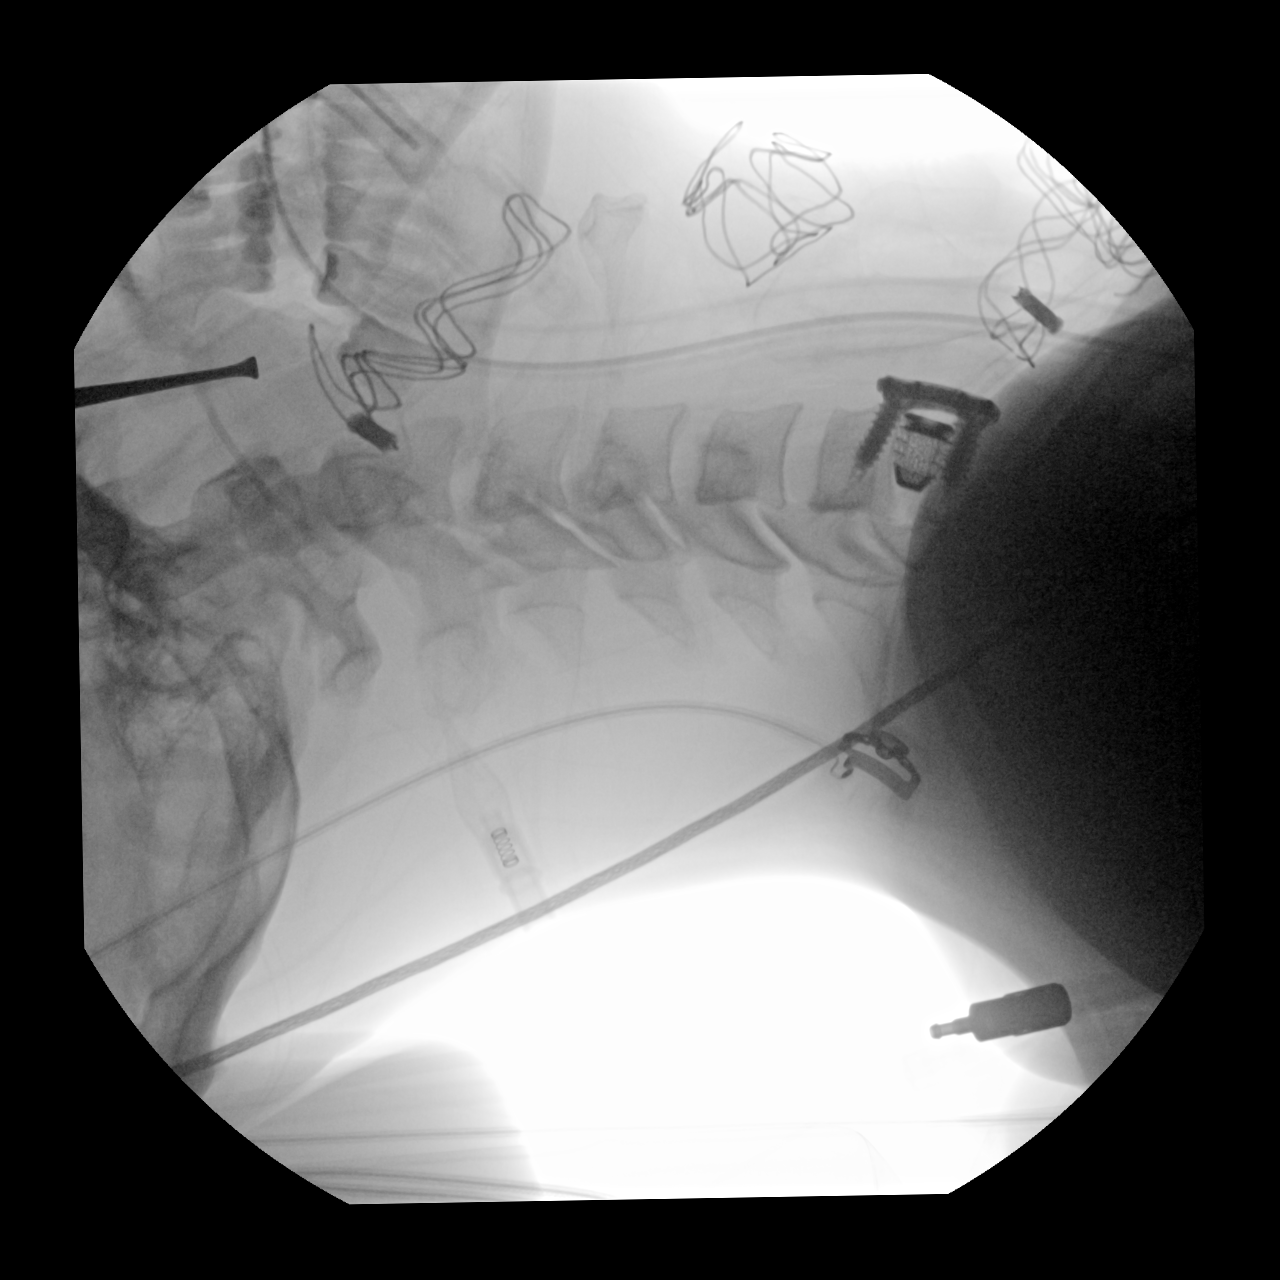
[im 3/3]
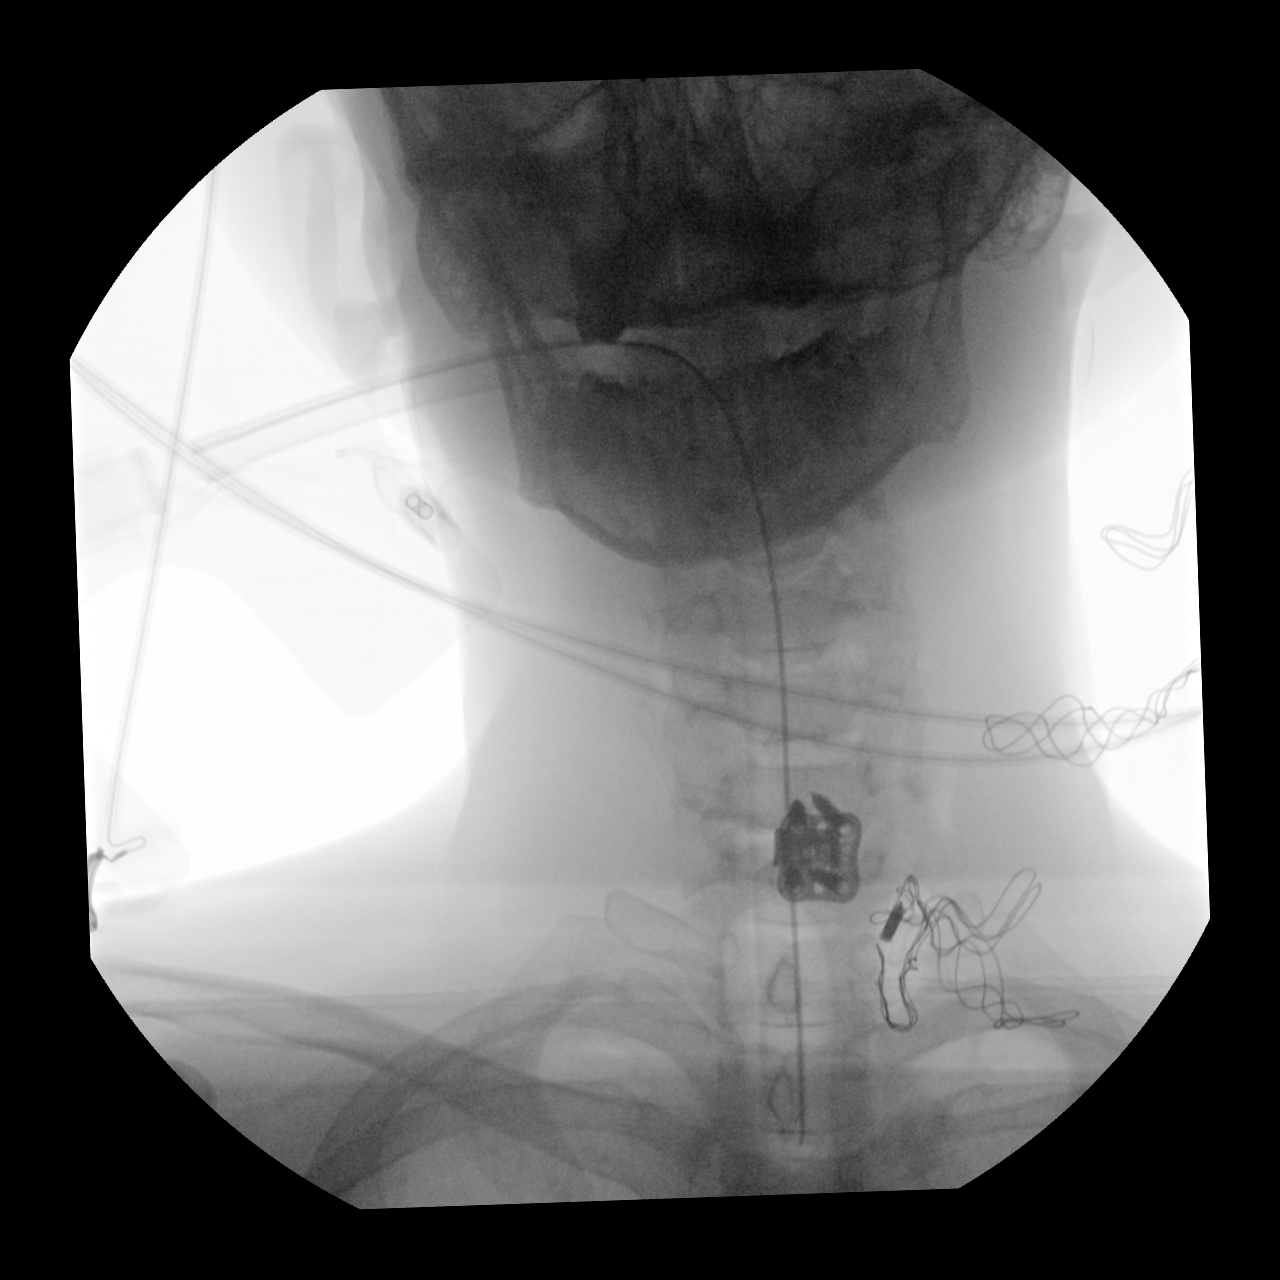

[3 of 3 positions shown; findings below may reference images not displayed]

FINDINGS: PA and lateral view intraoperative fluoroscopic images of the
cervical spine are submitted, 3 images total. On the initial lateral
view image taken at [DATE] a.m., a metallic probe projects ventral to
the C6 vertebral body. On the subsequent radiographs, there is ACDF
hardware (ventral plate and screws as well as interbody device) at
C6-C7 level. Curvilinear hyperdensities project ventral and lateral
to the cervical spine, which may reflect packing material/surgical
sponges.
IMPRESSION: Three intraoperative fluoroscopic images of the cervical spine from
C6-C7 ACDF, as described.

Curvilinear hyperdensities project ventral and lateral to the
cervical spine on the final images, which may reflect packing
material/surgical sponges. Correlate with the procedural history.

## 2020-01-19 IMAGING — CR DG CERVICAL SPINE 2 OR 3 VIEWS
1 series · 3 of 3 positions shown · non-contrast
Comparison: [DATE]

CLINICAL DATA: Status post C-spine surgery

EXAM:
CERVICAL SPINE - 2-3 VIEW

[Series 1: dg cervical spine 2 or 3 views · 0.14mm/px · 3 of 3 slices shown]
[im 1/3]
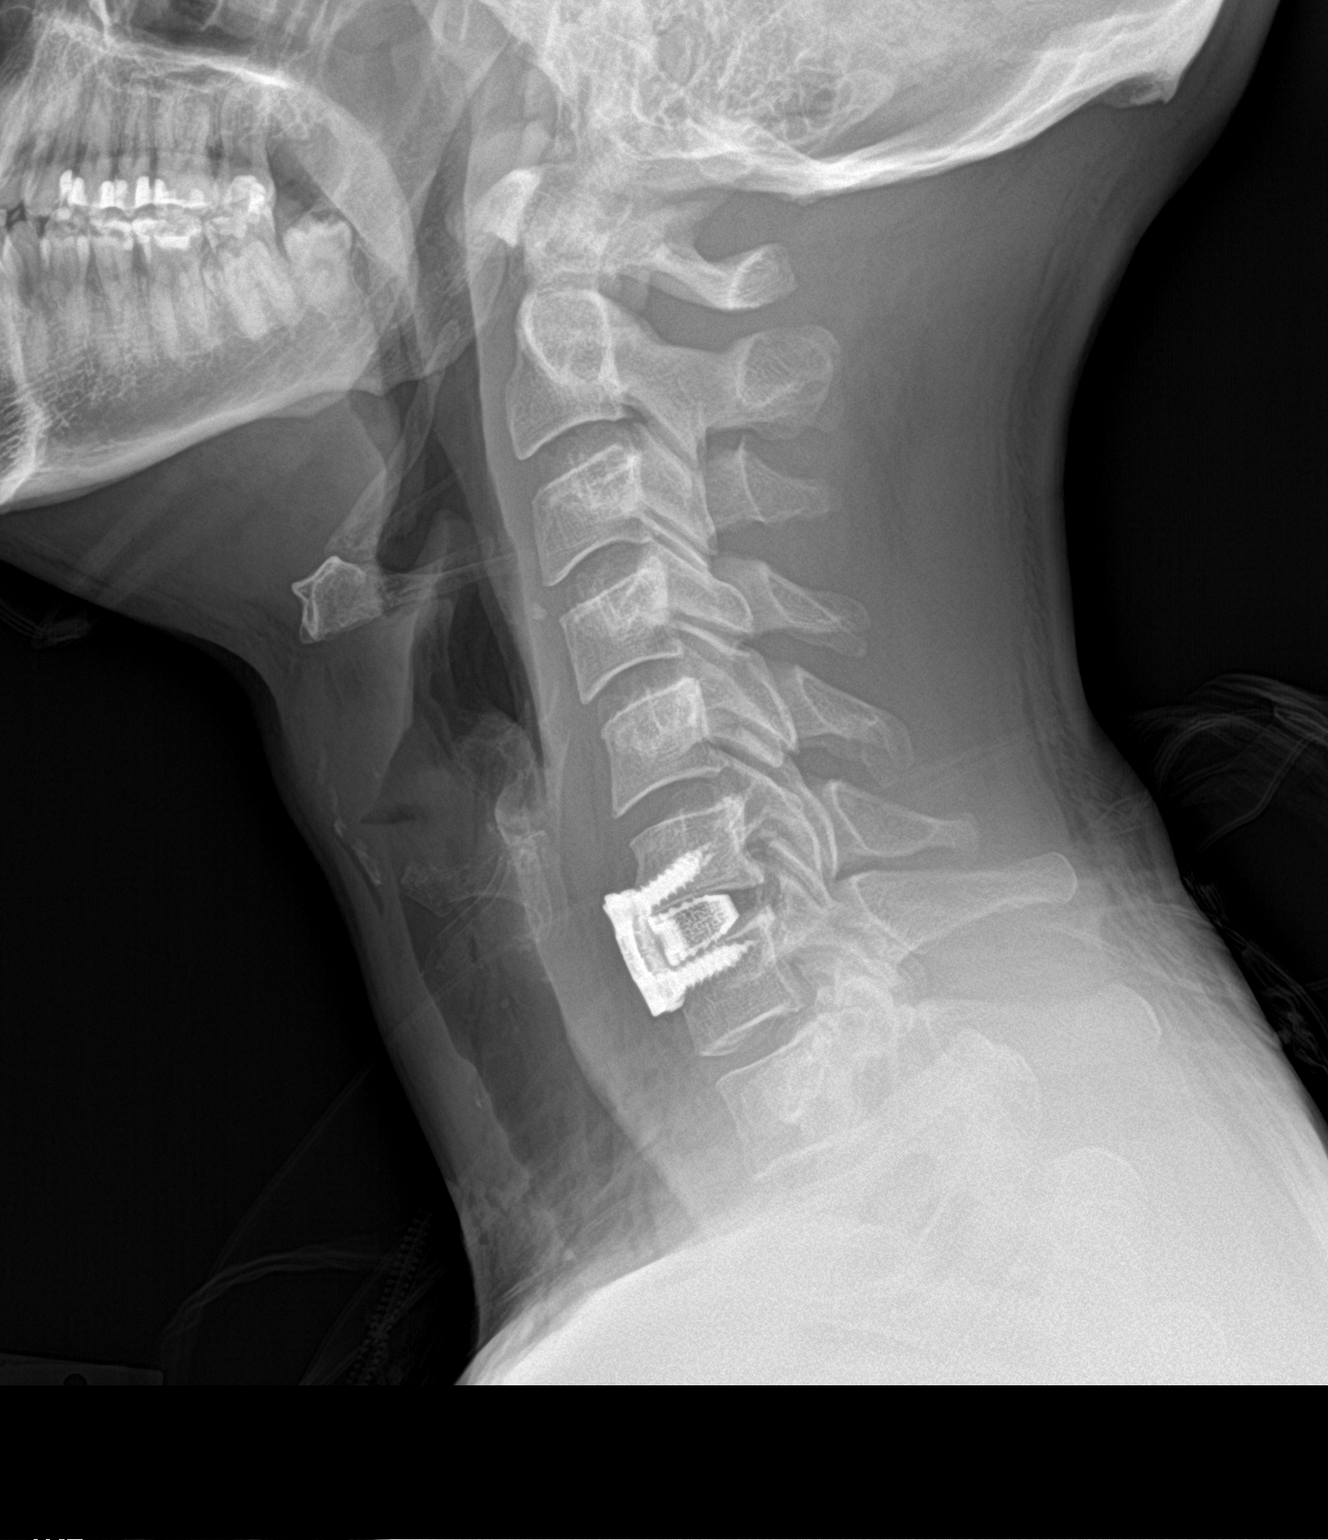
[im 2/3]
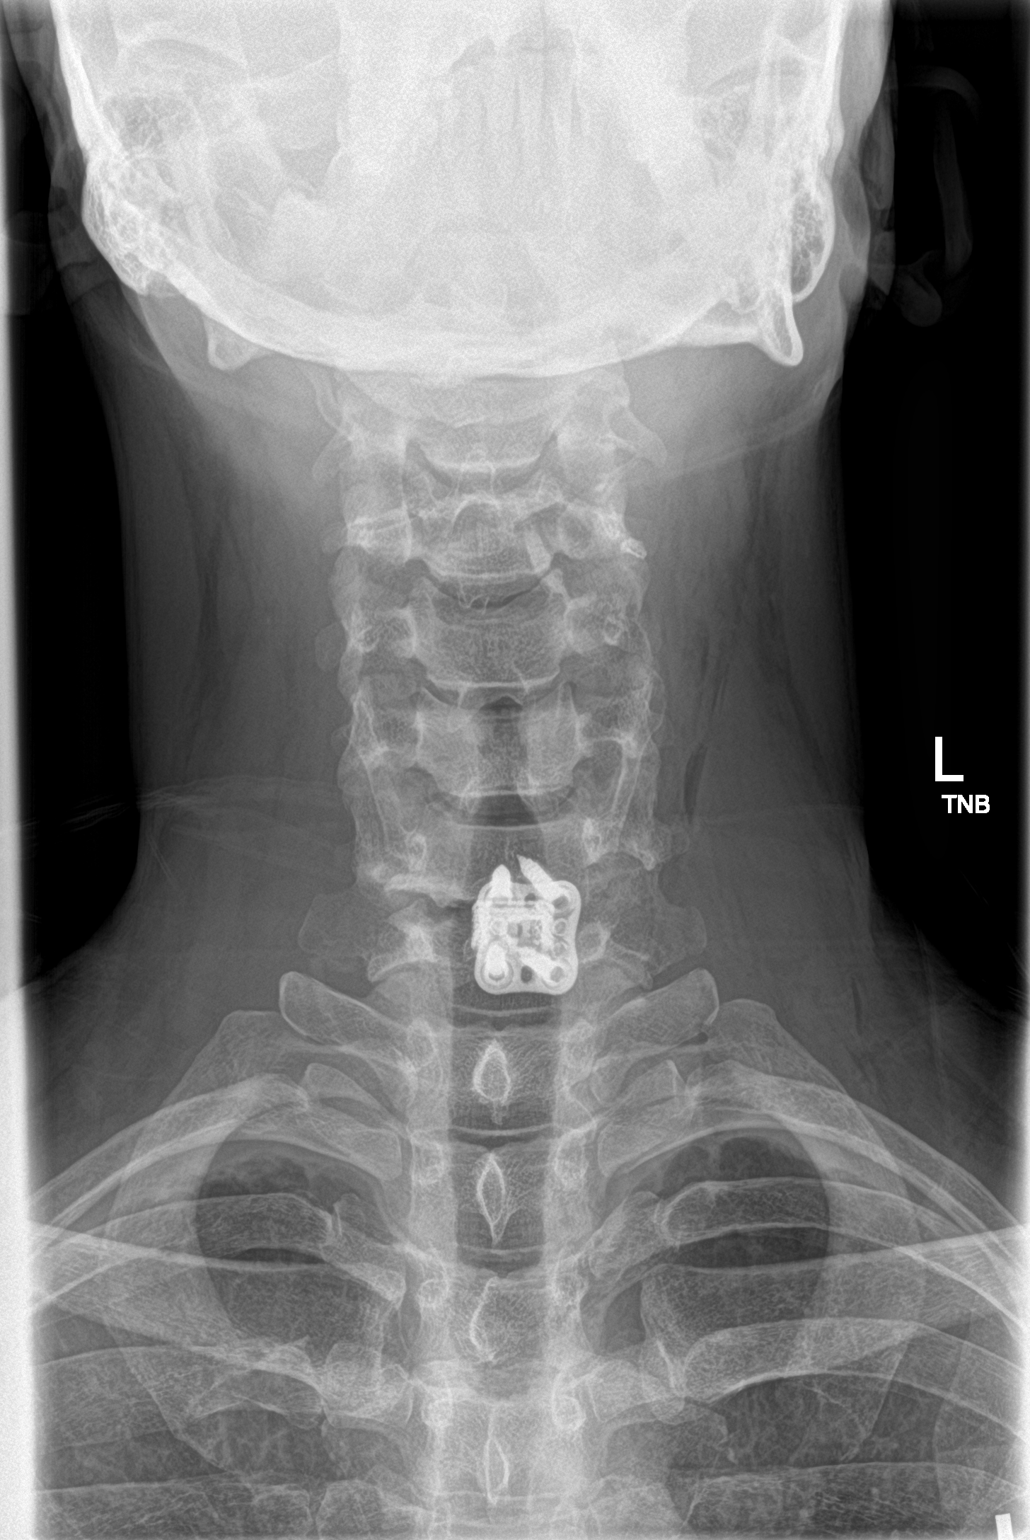
[im 3/3]
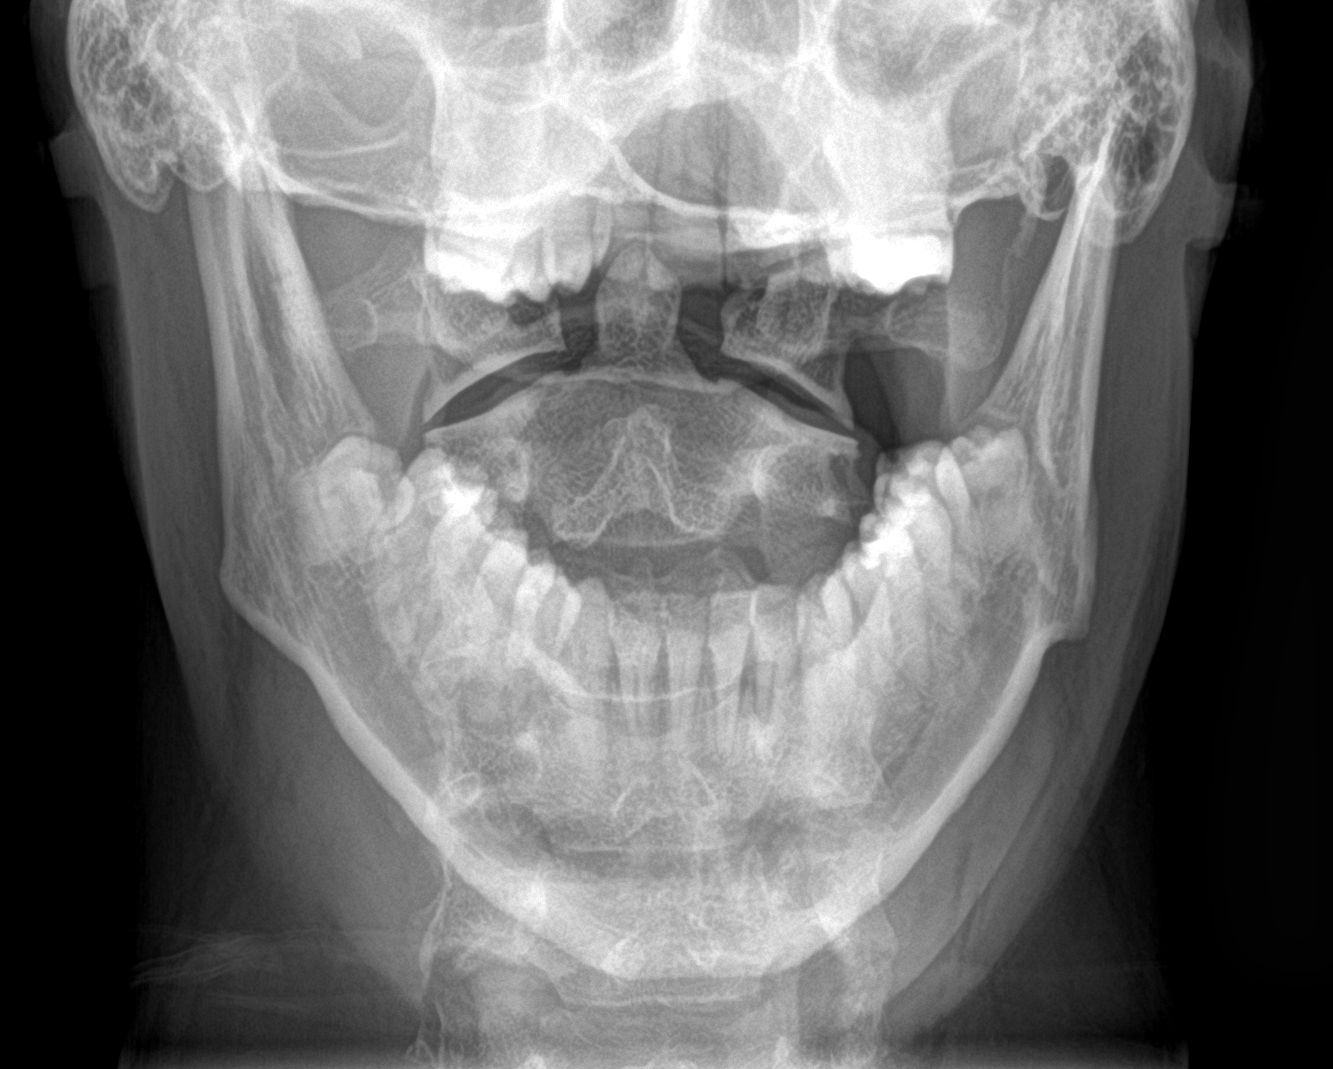

[3 of 3 positions shown; findings below may reference images not displayed]

FINDINGS: Normal cervical lordosis.

No evidence of fracture or dislocation. Vertebral body heights are
maintained. Dens appears intact. Lateral masses of C1 are symmetric.

Status post C6-7 ACDF with interbody spacer.

No radiopaque foreign body is seen.
IMPRESSION: Status post C6-7 ACDF, without evidence of complication.

## 2020-01-19 SURGERY — ANTERIOR CERVICAL DECOMPRESSION/DISCECTOMY FUSION 1 LEVEL
Anesthesia: General

## 2020-01-19 MED ORDER — PROPOFOL 10 MG/ML IV BOLUS
INTRAVENOUS | Status: DC | PRN
  Administered 2020-01-19: 150 mg via INTRAVENOUS
  Administered 2020-01-19: 50 mg via INTRAVENOUS

## 2020-01-19 MED ORDER — LIDOCAINE HCL (PF) 2 % IJ SOLN
INTRAMUSCULAR | Status: AC
  Filled 2020-01-19: qty 5

## 2020-01-19 MED ORDER — HYDROMORPHONE HCL 1 MG/ML IJ SOLN
INTRAMUSCULAR | Status: AC
  Administered 2020-01-19: 0.5 mg via INTRAVENOUS
  Filled 2020-01-19: qty 1

## 2020-01-19 MED ORDER — FAMOTIDINE 20 MG PO TABS: 20.0000 mg | ORAL_TABLET | Freq: Once | ORAL | Status: AC

## 2020-01-19 MED ORDER — THROMBIN 5000 UNITS EX SOLR
CUTANEOUS | Status: DC | PRN
  Administered 2020-01-19: 5000 [IU] via TOPICAL

## 2020-01-19 MED ORDER — DEXMEDETOMIDINE (PRECEDEX) IN NS 20 MCG/5ML (4 MCG/ML) IV SYRINGE
PREFILLED_SYRINGE | INTRAVENOUS | Status: DC | PRN
  Administered 2020-01-19: 8 ug via INTRAVENOUS
  Administered 2020-01-19: 12 ug via INTRAVENOUS

## 2020-01-19 MED ORDER — CEFAZOLIN SODIUM-DEXTROSE 2-4 GM/100ML-% IV SOLN
2.0000 g | INTRAVENOUS | Status: AC
  Administered 2020-01-19: 2 g via INTRAVENOUS

## 2020-01-19 MED ORDER — PROPOFOL 10 MG/ML IV BOLUS
INTRAVENOUS | Status: AC
  Filled 2020-01-19: qty 20

## 2020-01-19 MED ORDER — DEXAMETHASONE SODIUM PHOSPHATE 10 MG/ML IJ SOLN
INTRAMUSCULAR | Status: DC | PRN
  Administered 2020-01-19: 10 mg via INTRAVENOUS

## 2020-01-19 MED ORDER — ONDANSETRON HCL 4 MG/2ML IJ SOLN
INTRAMUSCULAR | Status: AC
  Filled 2020-01-19: qty 2

## 2020-01-19 MED ORDER — MIDAZOLAM HCL 2 MG/2ML IJ SOLN
INTRAMUSCULAR | Status: DC | PRN
  Administered 2020-01-19: 2 mg via INTRAVENOUS

## 2020-01-19 MED ORDER — HYDROMORPHONE HCL 1 MG/ML IJ SOLN
0.5000 mg | INTRAMUSCULAR | Status: DC | PRN
  Administered 2020-01-19: 0.5 mg via INTRAVENOUS

## 2020-01-19 MED ORDER — DEXMEDETOMIDINE (PRECEDEX) IN NS 20 MCG/5ML (4 MCG/ML) IV SYRINGE
PREFILLED_SYRINGE | INTRAVENOUS | Status: AC
  Filled 2020-01-19: qty 5

## 2020-01-19 MED ORDER — DEXAMETHASONE SODIUM PHOSPHATE 10 MG/ML IJ SOLN
INTRAMUSCULAR | Status: AC
  Filled 2020-01-19: qty 1

## 2020-01-19 MED ORDER — SODIUM CHLORIDE 0.9 % IV SOLN
INTRAVENOUS | Status: DC | PRN
  Administered 2020-01-19: 30 ug/min via INTRAVENOUS

## 2020-01-19 MED ORDER — ONDANSETRON HCL 4 MG/2ML IJ SOLN
INTRAMUSCULAR | Status: DC | PRN
  Administered 2020-01-19: 4 mg via INTRAVENOUS

## 2020-01-19 MED ORDER — OXYCODONE HCL 5 MG PO TABS
5.0000 mg | ORAL_TABLET | ORAL | 0 refills | Status: AC | PRN
Start: 2020-01-19 — End: 2020-01-24

## 2020-01-19 MED ORDER — SUCCINYLCHOLINE CHLORIDE 20 MG/ML IJ SOLN
INTRAMUSCULAR | Status: DC | PRN
  Administered 2020-01-19: 100 mg via INTRAVENOUS

## 2020-01-19 MED ORDER — FENTANYL CITRATE (PF) 100 MCG/2ML IJ SOLN
INTRAMUSCULAR | Status: AC
  Administered 2020-01-19: 25 ug via INTRAVENOUS
  Filled 2020-01-19: qty 2

## 2020-01-19 MED ORDER — ROCURONIUM BROMIDE 100 MG/10ML IV SOLN
INTRAVENOUS | Status: DC | PRN
  Administered 2020-01-19: 5 mg via INTRAVENOUS

## 2020-01-19 MED ORDER — OXYCODONE HCL 5 MG PO TABS
5.0000 mg | ORAL_TABLET | ORAL | 0 refills | Status: DC | PRN
Start: 2020-01-19 — End: 2020-01-19

## 2020-01-19 MED ORDER — CEFAZOLIN SODIUM-DEXTROSE 2-4 GM/100ML-% IV SOLN
INTRAVENOUS | Status: AC
  Filled 2020-01-19: qty 100

## 2020-01-19 MED ORDER — REMIFENTANIL HCL 1 MG IV SOLR
INTRAVENOUS | Status: DC | PRN
Start: 2020-01-19 — End: 2020-01-19
  Administered 2020-01-19: .02 ug/kg/min via INTRAVENOUS

## 2020-01-19 MED ORDER — ACETAMINOPHEN 10 MG/ML IV SOLN
INTRAVENOUS | Status: AC
  Filled 2020-01-19: qty 100

## 2020-01-19 MED ORDER — ORAL CARE MOUTH RINSE: 15.0000 mL | Freq: Once | OROMUCOSAL | Status: AC

## 2020-01-19 MED ORDER — SUCCINYLCHOLINE CHLORIDE 200 MG/10ML IV SOSY
PREFILLED_SYRINGE | INTRAVENOUS | Status: AC
  Filled 2020-01-19: qty 10

## 2020-01-19 MED ORDER — MIDAZOLAM HCL 2 MG/2ML IJ SOLN
INTRAMUSCULAR | Status: AC
  Filled 2020-01-19: qty 2

## 2020-01-19 MED ORDER — CHLORHEXIDINE GLUCONATE 0.12 % MT SOLN: 15.0000 mL | Freq: Once | OROMUCOSAL | Status: AC

## 2020-01-19 MED ORDER — OXYCODONE HCL 5 MG PO TABS: 5.0000 mg | ORAL_TABLET | Freq: Once | ORAL | Status: AC

## 2020-01-19 MED ORDER — FENTANYL CITRATE (PF) 100 MCG/2ML IJ SOLN
25.0000 ug | INTRAMUSCULAR | Status: DC | PRN
  Administered 2020-01-19 (×3): 25 ug via INTRAVENOUS

## 2020-01-19 MED ORDER — LACTATED RINGERS IV SOLN: Freq: Once | INTRAVENOUS | Status: AC

## 2020-01-19 MED ORDER — OXYCODONE HCL 5 MG PO TABS
ORAL_TABLET | ORAL | Status: AC
  Administered 2020-01-19: 5 mg via ORAL
  Filled 2020-01-19: qty 1

## 2020-01-19 MED ORDER — ONDANSETRON HCL 4 MG/2ML IJ SOLN: 4.0000 mg | Freq: Once | INTRAMUSCULAR | Status: DC | PRN

## 2020-01-19 MED ORDER — ACETAMINOPHEN 10 MG/ML IV SOLN
INTRAVENOUS | Status: DC | PRN
  Administered 2020-01-19: 1000 mg via INTRAVENOUS

## 2020-01-19 MED ORDER — METHOCARBAMOL 500 MG PO TABS: 500.0000 mg | ORAL_TABLET | Freq: Four times a day (QID) | ORAL | 0 refills | Status: DC

## 2020-01-19 MED ORDER — PHENYLEPHRINE HCL (PRESSORS) 10 MG/ML IV SOLN
INTRAVENOUS | Status: DC | PRN
  Administered 2020-01-19: 100 ug via INTRAVENOUS
  Administered 2020-01-19: 50 ug via INTRAVENOUS

## 2020-01-19 MED ORDER — LIDOCAINE HCL (CARDIAC) PF 100 MG/5ML IV SOSY
PREFILLED_SYRINGE | INTRAVENOUS | Status: DC | PRN
  Administered 2020-01-19: 80 mg via INTRAVENOUS

## 2020-01-19 MED ORDER — REMIFENTANIL HCL 1 MG IV SOLR
INTRAVENOUS | Status: AC
  Filled 2020-01-19: qty 1000

## 2020-01-19 MED ORDER — FENTANYL CITRATE (PF) 100 MCG/2ML IJ SOLN
INTRAMUSCULAR | Status: AC
  Filled 2020-01-19: qty 2

## 2020-01-19 MED ORDER — BUPIVACAINE-EPINEPHRINE (PF) 0.5% -1:200000 IJ SOLN
INTRAMUSCULAR | Status: DC | PRN
  Administered 2020-01-19: 5 mL

## 2020-01-19 MED ORDER — CHLORHEXIDINE GLUCONATE 0.12 % MT SOLN
OROMUCOSAL | Status: AC
  Administered 2020-01-19: 15 mL via OROMUCOSAL
  Filled 2020-01-19: qty 15

## 2020-01-19 MED ORDER — FAMOTIDINE 20 MG PO TABS
ORAL_TABLET | ORAL | Status: AC
  Administered 2020-01-19: 20 mg via ORAL
  Filled 2020-01-19: qty 1

## 2020-01-19 MED ORDER — ROCURONIUM BROMIDE 10 MG/ML (PF) SYRINGE
PREFILLED_SYRINGE | INTRAVENOUS | Status: AC
  Filled 2020-01-19: qty 10

## 2020-01-19 MED ORDER — FENTANYL CITRATE (PF) 100 MCG/2ML IJ SOLN
INTRAMUSCULAR | Status: DC | PRN
  Administered 2020-01-19: 100 ug via INTRAVENOUS

## 2020-01-19 SURGICAL SUPPLY — 63 items
ADH SKN CLS APL DERMABOND .7 (GAUZE/BANDAGES/DRESSINGS) ×1
AGENT HMST MTR 8 SURGIFLO (HEMOSTASIS) ×1
APL PRP STRL LF DISP 70% ISPRP (MISCELLANEOUS) ×2
BASKET BONE COLLECTION (BASKET) IMPLANT
BULB RESERV EVAC DRAIN JP 100C (MISCELLANEOUS) IMPLANT
BUR NEURO DRILL SOFT 3.0X3.8M (BURR) ×2 IMPLANT
CANISTER SUCT 1200ML W/VALVE (MISCELLANEOUS) ×4 IMPLANT
CHLORAPREP W/TINT 26 (MISCELLANEOUS) ×4 IMPLANT
COUNTER NEEDLE 20/40 LG (NEEDLE) ×2 IMPLANT
COVER WAND RF STERILE (DRAPES) ×2 IMPLANT
CUP MEDICINE 2OZ PLAST GRAD ST (MISCELLANEOUS) ×2 IMPLANT
DERMABOND ADVANCED (GAUZE/BANDAGES/DRESSINGS) ×1
DERMABOND ADVANCED .7 DNX12 (GAUZE/BANDAGES/DRESSINGS) ×1 IMPLANT
DRAIN CHANNEL JP 10F RND 20C F (MISCELLANEOUS) IMPLANT
DRAPE C ARM PK CFD 31 SPINE (DRAPES) ×4 IMPLANT
DRAPE LAPAROTOMY 77X122 PED (DRAPES) ×2 IMPLANT
DRAPE MICROSCOPE SPINE 48X150 (DRAPES) ×2 IMPLANT
DRAPE POUCH INSTRU U-SHP 10X18 (DRAPES) ×2 IMPLANT
DRAPE SURG 17X11 SM STRL (DRAPES) ×8 IMPLANT
ELECT CAUTERY BLADE TIP 2.5 (TIP) ×2
ELECT REM PT RETURN 9FT ADLT (ELECTROSURGICAL) ×2
ELECTRODE CAUTERY BLDE TIP 2.5 (TIP) ×1 IMPLANT
ELECTRODE REM PT RTRN 9FT ADLT (ELECTROSURGICAL) ×1 IMPLANT
FEE INTRAOP MONITOR IMPULS NCS (MISCELLANEOUS) IMPLANT
GLOVE BIOGEL PI IND STRL 7.0 (GLOVE) ×1 IMPLANT
GLOVE BIOGEL PI INDICATOR 7.0 (GLOVE) ×1
GLOVE SURG SYN 7.0 (GLOVE) ×4 IMPLANT
GLOVE SURG SYN 8.5  E (GLOVE) ×3
GLOVE SURG SYN 8.5 E (GLOVE) ×3 IMPLANT
GOWN SRG XL LVL 3 NONREINFORCE (GOWNS) ×1 IMPLANT
GOWN STRL NON-REIN TWL XL LVL3 (GOWNS) ×2
GOWN STRL REUS W/ TWL XL LVL3 (GOWN DISPOSABLE) ×1 IMPLANT
GOWN STRL REUS W/TWL XL LVL3 (GOWN DISPOSABLE) ×2
GRADUATE 1200CC STRL 31836 (MISCELLANEOUS) ×2 IMPLANT
INTRAOP MONITOR FEE IMPULS NCS (MISCELLANEOUS)
INTRAOP MONITOR FEE IMPULSE (MISCELLANEOUS)
KIT TURNOVER KIT A (KITS) ×2 IMPLANT
MANIFOLD NEPTUNE II (INSTRUMENTS) ×2 IMPLANT
MARKER SKIN DUAL TIP RULER LAB (MISCELLANEOUS) ×4 IMPLANT
NDL SAFETY ECLIPSE 18X1.5 (NEEDLE) ×1 IMPLANT
NEEDLE HYPO 18GX1.5 SHARP (NEEDLE) ×2
NEEDLE HYPO 22GX1.5 SAFETY (NEEDLE) ×2 IMPLANT
NS IRRIG 1000ML POUR BTL (IV SOLUTION) ×2 IMPLANT
PACK LAMINECTOMY NEURO (CUSTOM PROCEDURE TRAY) ×2 IMPLANT
PAD ARMBOARD 7.5X6 YLW CONV (MISCELLANEOUS) ×2 IMPLANT
PIN CASPAR 14 (PIN) ×1 IMPLANT
PIN CASPAR 14MM (PIN) ×2
PLATE ANT CERV XTEND 1 LV 12 (Plate) ×2 IMPLANT
PUTTY DBX 1CC (Putty) ×2 IMPLANT
PUTTY DBX 1CC DEPUY (Putty) ×1 IMPLANT
SCREW VAR 4.2 XD SELF DRILL 16 (Screw) ×8 IMPLANT
SPACER HEDRON C 12X14X8 7D (Spacer) ×2 IMPLANT
SPOGE SURGIFLO 8M (HEMOSTASIS) ×1
SPONGE KITTNER 5P (MISCELLANEOUS) ×2 IMPLANT
SPONGE SURGIFLO 8M (HEMOSTASIS) ×1 IMPLANT
STAPLER SKIN PROX 35W (STAPLE) IMPLANT
SUT V-LOC 90 ABS DVC 3-0 CL (SUTURE) ×2 IMPLANT
SUT VIC AB 3-0 SH 8-18 (SUTURE) ×2 IMPLANT
SYR 30ML LL (SYRINGE) ×2 IMPLANT
TAPE CLOTH 3X10 WHT NS LF (GAUZE/BANDAGES/DRESSINGS) ×2 IMPLANT
TOWEL OR 17X26 4PK STRL BLUE (TOWEL DISPOSABLE) ×6 IMPLANT
TRAY FOLEY MTR SLVR 16FR STAT (SET/KITS/TRAYS/PACK) IMPLANT
TUBING CONNECTING 10 (TUBING) ×2 IMPLANT

## 2020-01-19 NOTE — Op Note (Signed)
Indications: James Boyer is suffering from cervical radiculopathy and right arm weakness. he failed conservative management, and elected to proceed with surgery.  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   Preoperative Note:   Risks of surgery discussed include: infection, bleeding, stroke, coma, death, paralysis, CSF leak, nerve/spinal cord injury, numbness, tingling, weakness, complex regional pain syndrome, recurrent stenosis and/or disc herniation, vascular injury, development of instability, neck/back pain, need for further surgery, persistent symptoms, development of deformity, and the risks of anesthesia. The patient understood these risks and agreed to proceed.  Operative Note:   Procedure:  1) Anterior cervical diskectomy and fusion at C6-7 2) Anterior cervical instrumentation at C6-7 using Globus Xtend 3) Insertion of biomechanical device at C6-7   Procedure: After obtaining informed consent, the patient taken to the operating room, placed in supine position, general anesthesia induced.  The patient had a small shoulder roll placed behind their shoulders.  The patient received preop antibiotics and IV Decadron.  The patient had a neck incision outlined, was prepped and draped in usual sterile fashion. The incision was injected with local anesthetic.   An incision was opened, dissection taken down medial to the carotid artery and jugular vein, lateral to the trachea and esophagus.  The prevertebral fascia was identified, and a localizing x-ray demonstrated the correct level.  The longus colli were dissected laterally, and self-retaining retractors placed to open the operative field. The microscope was then brought into the field.  With this  complete, distractor pins were placed in the vertebral bodies of C6 and C7. The distractor was placed, and the anulus at C6/7 was opened using a bovie.  Curettes and pituitary rongeurs used to remove the majority of disk, then the drill was used to remove the posterior osteophyte and begin the foraminotomies. The nerve hook was used to elevate the posterior longitudinal ligament, which was then removed with Kerrison rongeurs. Bilateral foraminotomies were performed. The microblunt nerve hook could be passed out the foramen bilaterally at each level.   Meticulous hemostasis was obtained.  A biomechanical device (Globus Hedron 8 mm height x 14 mm width by 12 mm depth) was placed at C6/7. The device had been filled with allograft for aid in arthrodesis.  The caspar distractor was removed, and bone wax used for hemostasis. A 12 mm Globus Xtend plate was chosen.  Two screws placed in each vertebral body, respectively making sure the screws were behind the locking mechanism.  Final AP and lateral radiographs were taken.   With everything in good position, the wound was irrigated copiously with bacitracin-containing solution and meticulous hemostasis obtained.  Wound was closed in 2 layers using interrupted inverted 3-0 Vicryl sutures.  The wound was dressed with dermabond, the head of bed at 30 degrees, taken to recovery room in stable condition.  No new postop neurological deficits were identified.  Sponge and pattie counts were correct at the end of the procedure.   I performed the entire procedure with the assistance of Patsey Berthold NP as an Designer, television/film set.  Venetia Night MD

## 2020-01-19 NOTE — Discharge Summary (Signed)
Procedure: ACDF C6-7 Procedure date: 01/19/2020 Diagnosis: cervical radiculopathy    History: James Boyer is s/p ACDF C6-7 POD0: Tolerated procedure well. Evaluated in post op recovery still disoriented from anesthesia but able to answer questions and obey commands.   Physical Exam: Vitals:   01/19/20 0626  BP: (!) 143/103  Resp: 16  Temp: 98.9 F (37.2 C)  SpO2: 98%    General: Alert and oriented, lying in bed Strength:5/5 throughout  Sensation: intact and symmetric throughout  Skin: incision clean, dry, intact  Data:  No results for input(s): NA, K, CL, CO2, BUN, CREATININE, LABGLOM, GLUCOSE, CALCIUM in the last 168 hours. No results for input(s): AST, ALT, ALKPHOS in the last 168 hours.  Invalid input(s): TBILI   No results for input(s): WBC, HGB, HCT, PLT in the last 168 hours. Recent Labs  Lab 01/17/20 0854  APTT 29  INR 0.9         Assessment/Plan:  James Boyer is POD0 s/p C6-7 ACDF.   Once he is able to ambulate, tolerate PO, and urinate, he is cleared to go home. He should remain in the hospital for at least 4 hours postoperatively.  He will follow up in clinic in 2 weeks.  Patsey Berthold, NP Department of Neurosurgery
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
## 2020-01-19 NOTE — Anesthesia Procedure Notes (Signed)
Procedure Name: Intubation Date/Time: 01/19/2020 7:20 AM Performed by: Karoline Caldwell, CRNA Pre-anesthesia Checklist: Patient identified, Patient being monitored, Timeout performed, Emergency Drugs available and Suction available Patient Re-evaluated:Patient Re-evaluated prior to induction Oxygen Delivery Method: Circle system utilized Preoxygenation: Pre-oxygenation with 100% oxygen Induction Type: IV induction Ventilation: Mask ventilation without difficulty Laryngoscope Size: McGraph and 4 Grade View: Grade I Tube type: Oral Tube size: 7.0 mm Number of attempts: 1 Airway Equipment and Method: Stylet and Video-laryngoscopy Placement Confirmation: ETT inserted through vocal cords under direct vision,  positive ETCO2 and breath sounds checked- equal and bilateral Secured at: 22 cm Tube secured with: Tape Dental Injury: Teeth and Oropharynx as per pre-operative assessment

## 2020-01-19 NOTE — Discharge Instructions (Signed)
Your surgeon has performed an operation on your cervical spine (neck) to relieve pressure on the spinal cord and/or nerves. This involved making an incision in the front of your neck and removing one or more of the discs that support your spine. Next, a small piece of bone, a titanium plate, and screws were used to fuse two or more of the vertebrae (bones) together.  The following are instructions to help in your recovery once you have been discharged from the hospital. Even if you feel well, it is important that you follow these activity guidelines. If you do not let your neck heal properly from the surgery, you can increase the chance of return of your symptoms and other complications.  * Do not take anti-inflammatory medications for 3 months after surgery (naproxen [Aleve], ibuprofen [Advil, Motrin], etc.). These medications can prevent your bones from healing properly.  Activity    No bending, lifting, or twisting ("BLT"). Avoid lifting objects heavier than 10 pounds (gallon milk jug).  Where possible, avoid household activities that involve lifting, bending, reaching, pushing, or pulling such as laundry, vacuuming, grocery shopping, and childcare. Try to arrange for help from friends and family for these activities while your back heals.  Increase physical activity slowly as tolerated.  Taking short walks is encouraged, but avoid strenuous exercise. Do not jog, run, bicycle, lift weights, or participate in any other exercises unless specifically allowed by your doctor.  Talk to your doctor before resuming sexual activity.  You should not drive until cleared by your doctor.  Until released by your doctor, you should not return to work or school.  You should rest at home and let your body heal.   You may shower three days after your surgery.  After showering, lightly dab your incision dry. Do not take a tub bath or go swimming until approved by your doctor at your follow-up appointment.  If  your doctor ordered a cervical collar (neck brace) for you, you should wear it whenever you are out of bed. You may remove it when lying down or sleeping, but you should wear it at all other times. Not all neck surgeries require a cervical collar.  If you smoke, we strongly recommend that you quit.  Smoking has been proven to interfere with normal bone healing and will dramatically reduce the success rate of your surgery. Please contact [facility] (800-QUIT-NOW) and use the resources at www.[facility].com for assistance in stopping smoking.  Surgical Incision   If you have a dressing on your incision, you may remove it two days after your surgery. Keep your incision area clean and dry.  If you have staples or stitches on your incision, you should have a follow up scheduled for removal. If you do not have staples or stitches, you will have steri-strips (small pieces of surgical tape) or Dermabond glue. The steri-strips/glue should begin to peel away within about a week (it is fine if the steri-strips fall off before then). If the strips are still in place one week after your surgery, you may gently remove them.  Diet           You may return to your usual diet. However, you may experience discomfort when swallowing in the first month after your surgery. This is normal. You may find that softer foods are more comfortable for you to swallow. Be sure to stay hydrated.  When to Contact Us  You may experience pain in your neck and/or pain between your shoulder blades. This   is normal and should improve in the next few weeks with the help of pain medication, muscle relaxers, and rest. Some patients report that a warm compress on the back of the neck or between the shoulder blades helps.  However, should you experience any of the following, contact us immediately: . New numbness or weakness . Pain that is progressively getting worse, and is not relieved by your pain medication, muscle relaxers, rest, and  warm compresses . Bleeding, redness, swelling, pain, or drainage from surgical incision . Chills or flu-like symptoms . Fever greater than 101.0 F (38.3 C) . Inability to eat, drink fluids, or take medications . Problems with bowel or bladder functions . Difficulty breathing or shortness of breath . Warmth, tenderness, or swelling in your calf Contact Information . During office hours (Monday-Friday 9 am to 5 pm), please call your physician at [phone] and ask for Kendelyn Jean . After hours and weekends, please call [phone] and an answering service will put you in touch with either Dr. Cook or Dr. Yarbrough.  . For a life-threatening emergency, call 911  AMBULATORY SURGERY  DISCHARGE INSTRUCTIONS  1) The drugs that you were given will stay in your system until tomorrow so for the next 24 hours you should not: A) Drive an automobile B) Make any legal decisions C) Drink any alcoholic beverage  2) You may resume regular meals tomorrow.  Today it is better to start with liquids and gradually work up to solid foods. You may eat anything you prefer, but it is better to start with liquids, then soup and crackers, and gradually work up to solid foods.  3) Please notify your doctor immediately if you have any unusual bleeding, trouble breathing, redness and pain at the surgery site, drainage, fever, or pain not relieved by medication.  4) Additional Instructions:   Please contact your physician with any problems or Same Day Surgery at [phone], Monday through Friday 6 am to 4 pm, or [facility] number at [phone].  

## 2020-01-19 NOTE — H&P (Signed)
I have reviewed and confirmed my history and physical from 01/12/2020 with no additions or changes. Plan for C6-7 ACDF.  Risks and benefits reviewed.  Heart sounds normal no MRG. Chest Clear to Auscultation Bilaterally.

## 2020-01-19 NOTE — Transfer of Care (Signed)
Immediate Anesthesia Transfer of Care Note  Patient: James Boyer  Procedure(s) Performed: ANTERIOR CERVICAL DECOMPRESSION/DISCECTOMY FUSION 1 LEVEL C6-7 (N/A )  Patient Location: PACU  Anesthesia Type:General  Level of Consciousness: sedated  Airway & Oxygen Therapy: Patient Spontanous Breathing and Patient connected to face mask oxygen  Post-op Assessment: Report given to RN and Post -op Vital signs reviewed and stable  Post vital signs: Reviewed and stable  Last Vitals:  Vitals Value Taken Time  BP 136/88 01/19/20 0854  Temp 36.1 C 01/19/20 0854  Pulse 83 01/19/20 0854  Resp 16 01/19/20 0854  SpO2 100 % 01/19/20 0854    Last Pain:  Vitals:   01/19/20 0854  TempSrc: Temporal  PainSc: 0-No pain         Complications: No complications documented.

## 2020-01-22 NOTE — Anesthesia Postprocedure Evaluation (Signed)
Anesthesia Post Note  Patient: James Boyer  Procedure(s) Performed: ANTERIOR CERVICAL DECOMPRESSION/DISCECTOMY FUSION 1 LEVEL C6-7 (N/A )  Patient location during evaluation: PACU Anesthesia Type: General Level of consciousness: awake and alert Pain management: pain level controlled Vital Signs Assessment: post-procedure vital signs reviewed and stable Respiratory status: spontaneous breathing, nonlabored ventilation, respiratory function stable and patient connected to nasal cannula oxygen Cardiovascular status: blood pressure returned to baseline and stable Postop Assessment: no apparent nausea or vomiting Anesthetic complications: no   No complications documented.   Last Vitals:  Vitals:   01/19/20 1045 01/19/20 1255  BP: (!) 141/98 127/89  Pulse: 65 70  Resp: 16   Temp: (!) 36.3 C (!) 36.2 C  SpO2: 97% 98%    Last Pain:  Vitals:   01/22/20 0820  TempSrc:   PainSc: 0-No pain                 Yevette Edwards

## 2020-02-06 ENCOUNTER — Other Ambulatory Visit: Payer: Self-pay

## 2020-02-06 ENCOUNTER — Encounter: Payer: Self-pay | Admitting: Occupational Therapy

## 2020-02-06 ENCOUNTER — Ambulatory Visit: Payer: BC Managed Care – PPO | Attending: Orthopedic Surgery | Admitting: Occupational Therapy

## 2020-02-06 DIAGNOSIS — M79642 Pain in left hand: Secondary | ICD-10-CM | POA: Diagnosis present

## 2020-02-06 DIAGNOSIS — M25632 Stiffness of left wrist, not elsewhere classified: Secondary | ICD-10-CM | POA: Diagnosis present

## 2020-02-06 DIAGNOSIS — M25642 Stiffness of left hand, not elsewhere classified: Secondary | ICD-10-CM | POA: Insufficient documentation

## 2020-02-06 DIAGNOSIS — M6281 Muscle weakness (generalized): Secondary | ICD-10-CM | POA: Insufficient documentation

## 2020-02-06 DIAGNOSIS — L905 Scar conditions and fibrosis of skin: Secondary | ICD-10-CM | POA: Diagnosis not present

## 2020-02-06 NOTE — Patient Instructions (Signed)
Contrast at home 2-3 x day Scar massage on dorsal wrist - but volar 2 sterri strips replace  cica scar pad for dorsal wrist scar for night time   AROM tendon glides 10 reps Opposition to base of 5th digit- 8 reps Slide down 5th   AROM for wrist flexion , ext, sup , pron 10 reps And AAROM hands together for RD, UD -10 reps  Keep pain under 2/10 - slight pull or stretch
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
## 2020-02-09 ENCOUNTER — Ambulatory Visit: Payer: BC Managed Care – PPO | Admitting: Occupational Therapy

## 2020-02-09 ENCOUNTER — Other Ambulatory Visit: Payer: Self-pay

## 2020-02-09 DIAGNOSIS — M25642 Stiffness of left hand, not elsewhere classified: Secondary | ICD-10-CM

## 2020-02-09 DIAGNOSIS — L905 Scar conditions and fibrosis of skin: Secondary | ICD-10-CM | POA: Diagnosis not present

## 2020-02-09 DIAGNOSIS — M79642 Pain in left hand: Secondary | ICD-10-CM

## 2020-02-09 DIAGNOSIS — M6281 Muscle weakness (generalized): Secondary | ICD-10-CM

## 2020-02-09 DIAGNOSIS — M25632 Stiffness of left wrist, not elsewhere classified: Secondary | ICD-10-CM
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
## 2020-02-12 ENCOUNTER — Ambulatory Visit: Payer: BC Managed Care – PPO | Admitting: Occupational Therapy

## 2020-02-12 ENCOUNTER — Other Ambulatory Visit: Payer: Self-pay

## 2020-02-12 DIAGNOSIS — M6281 Muscle weakness (generalized): Secondary | ICD-10-CM

## 2020-02-12 DIAGNOSIS — M79642 Pain in left hand: Secondary | ICD-10-CM

## 2020-02-12 DIAGNOSIS — M25632 Stiffness of left wrist, not elsewhere classified: Secondary | ICD-10-CM

## 2020-02-12 DIAGNOSIS — L905 Scar conditions and fibrosis of skin: Secondary | ICD-10-CM | POA: Diagnosis not present

## 2020-02-12 DIAGNOSIS — M25642 Stiffness of left hand, not elsewhere classified: Secondary | ICD-10-CM
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
## 2020-02-22 ENCOUNTER — Ambulatory Visit: Payer: BC Managed Care – PPO | Admitting: Occupational Therapy

## 2020-02-27 ENCOUNTER — Ambulatory Visit: Payer: BC Managed Care – PPO | Attending: Orthopedic Surgery | Admitting: Occupational Therapy

## 2020-02-27 ENCOUNTER — Other Ambulatory Visit: Payer: Self-pay

## 2020-02-27 DIAGNOSIS — M6281 Muscle weakness (generalized): Secondary | ICD-10-CM | POA: Diagnosis present

## 2020-02-27 DIAGNOSIS — L905 Scar conditions and fibrosis of skin: Secondary | ICD-10-CM | POA: Diagnosis present

## 2020-02-27 DIAGNOSIS — M79642 Pain in left hand: Secondary | ICD-10-CM | POA: Diagnosis present

## 2020-02-27 DIAGNOSIS — M25632 Stiffness of left wrist, not elsewhere classified: Secondary | ICD-10-CM | POA: Diagnosis present

## 2020-02-27 DIAGNOSIS — M25642 Stiffness of left hand, not elsewhere classified: Secondary | ICD-10-CM | POA: Diagnosis present
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
## 2020-03-01 ENCOUNTER — Ambulatory Visit: Payer: BC Managed Care – PPO | Admitting: Occupational Therapy

## 2020-03-01 ENCOUNTER — Encounter: Payer: Self-pay | Admitting: Occupational Therapy

## 2020-03-01 ENCOUNTER — Other Ambulatory Visit: Payer: Self-pay

## 2020-03-01 DIAGNOSIS — L905 Scar conditions and fibrosis of skin: Secondary | ICD-10-CM

## 2020-03-01 DIAGNOSIS — M6281 Muscle weakness (generalized): Secondary | ICD-10-CM

## 2020-03-01 DIAGNOSIS — M25632 Stiffness of left wrist, not elsewhere classified: Secondary | ICD-10-CM

## 2020-03-01 DIAGNOSIS — M25642 Stiffness of left hand, not elsewhere classified: Secondary | ICD-10-CM

## 2020-03-01 DIAGNOSIS — M79642 Pain in left hand: Secondary | ICD-10-CM
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
## 2020-03-05 ENCOUNTER — Ambulatory Visit: Payer: BC Managed Care – PPO | Admitting: Occupational Therapy

## 2020-03-05 ENCOUNTER — Other Ambulatory Visit: Payer: Self-pay

## 2020-03-05 DIAGNOSIS — M6281 Muscle weakness (generalized): Secondary | ICD-10-CM

## 2020-03-05 DIAGNOSIS — L905 Scar conditions and fibrosis of skin: Secondary | ICD-10-CM

## 2020-03-05 DIAGNOSIS — M25632 Stiffness of left wrist, not elsewhere classified: Secondary | ICD-10-CM

## 2020-03-05 DIAGNOSIS — M79642 Pain in left hand: Secondary | ICD-10-CM

## 2020-03-05 DIAGNOSIS — M25642 Stiffness of left hand, not elsewhere classified: Secondary | ICD-10-CM
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
## 2020-03-08 ENCOUNTER — Ambulatory Visit: Payer: BC Managed Care – PPO | Admitting: Occupational Therapy

## 2020-03-08 ENCOUNTER — Other Ambulatory Visit: Payer: Self-pay

## 2020-03-08 DIAGNOSIS — L905 Scar conditions and fibrosis of skin: Secondary | ICD-10-CM

## 2020-03-08 DIAGNOSIS — M79642 Pain in left hand: Secondary | ICD-10-CM

## 2020-03-08 DIAGNOSIS — M6281 Muscle weakness (generalized): Secondary | ICD-10-CM

## 2020-03-08 DIAGNOSIS — M25632 Stiffness of left wrist, not elsewhere classified: Secondary | ICD-10-CM

## 2020-03-08 DIAGNOSIS — M25642 Stiffness of left hand, not elsewhere classified: Secondary | ICD-10-CM
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
## 2020-03-12 ENCOUNTER — Ambulatory Visit: Payer: BC Managed Care – PPO | Admitting: Occupational Therapy

## 2020-03-15 ENCOUNTER — Ambulatory Visit: Payer: BC Managed Care – PPO | Admitting: Occupational Therapy

## 2020-03-15 ENCOUNTER — Other Ambulatory Visit: Payer: Self-pay

## 2020-03-15 DIAGNOSIS — M6281 Muscle weakness (generalized): Secondary | ICD-10-CM

## 2020-03-15 DIAGNOSIS — M79642 Pain in left hand: Secondary | ICD-10-CM

## 2020-03-15 DIAGNOSIS — M25632 Stiffness of left wrist, not elsewhere classified: Secondary | ICD-10-CM

## 2020-03-15 DIAGNOSIS — L905 Scar conditions and fibrosis of skin: Secondary | ICD-10-CM | POA: Diagnosis not present

## 2020-03-15 DIAGNOSIS — M25642 Stiffness of left hand, not elsewhere classified: Secondary | ICD-10-CM
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
## 2020-03-19 ENCOUNTER — Ambulatory Visit: Payer: BC Managed Care – PPO | Admitting: Occupational Therapy

## 2020-03-19 ENCOUNTER — Other Ambulatory Visit: Payer: Self-pay

## 2020-03-19 DIAGNOSIS — M25642 Stiffness of left hand, not elsewhere classified: Secondary | ICD-10-CM

## 2020-03-19 DIAGNOSIS — M6281 Muscle weakness (generalized): Secondary | ICD-10-CM

## 2020-03-19 DIAGNOSIS — M79642 Pain in left hand: Secondary | ICD-10-CM

## 2020-03-19 DIAGNOSIS — L905 Scar conditions and fibrosis of skin: Secondary | ICD-10-CM

## 2020-03-19 DIAGNOSIS — M25632 Stiffness of left wrist, not elsewhere classified: Secondary | ICD-10-CM
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
## 2020-03-21 ENCOUNTER — Other Ambulatory Visit: Payer: Self-pay | Admitting: Orthopedic Surgery

## 2020-03-22 ENCOUNTER — Ambulatory Visit: Payer: BC Managed Care – PPO | Admitting: Occupational Therapy

## 2020-03-26 ENCOUNTER — Ambulatory Visit: Payer: BC Managed Care – PPO | Admitting: Occupational Therapy

## 2020-03-29 ENCOUNTER — Ambulatory Visit: Payer: BC Managed Care – PPO | Admitting: Occupational Therapy

## 2020-04-03 ENCOUNTER — Encounter
Admission: RE | Admit: 2020-04-03 | Discharge: 2020-04-03 | Disposition: A | Discharge status: Home / Self Care | Payer: BC Managed Care – PPO | Source: Ambulatory Visit | Attending: Orthopedic Surgery | Admitting: Orthopedic Surgery

## 2020-04-03 ENCOUNTER — Other Ambulatory Visit: Payer: Self-pay

## 2020-04-03 NOTE — Patient Instructions (Signed)
Your procedure is scheduled on: Thursday April 11, 2020 Report to the Registration Desk on the 1st floor of the [facility] To find out your arrival time, please call [phone] between 1PM - 3PM on: Wednesday April 10, 2020  REMEMBER: Instructions that are not followed completely may result in serious medical risk, up to and including death; or upon the discretion of your surgeon and anesthesiologist your surgery may need to be rescheduled.  Do not eat food after midnight the night before surgery.  No gum chewing, lozengers or hard candies.  You may however, drink CLEAR liquids up to 2 hours before you are scheduled to arrive for your surgery. Do not drink anything within 2 hours of your scheduled arrival time.  Clear liquids include: - water  - apple juice without pulp - gatorade (not RED, PURPLE, OR BLUE) - black coffee or tea (Do NOT add milk or creamers to the coffee or tea) Do NOT drink anything that is not on this list.  Type 1 and Type 2 diabetics should only drink water.  In addition, your doctor has ordered for you to drink the provided  Ensure Pre-Surgery Clear Carbohydrate Drink  Drinking this carbohydrate drink up to two hours before surgery helps to reduce insulin resistance and improve patient outcomes. Please complete drinking 2 hours prior to scheduled arrival time.  TAKE THESE MEDICATIONS THE MORNING OF SURGERY WITH A SIP OF WATER: None  One week prior to surgery: Stop Anti-inflammatories (NSAIDS) such as Advil, Aleve, Ibuprofen, Motrin, Naproxen, Naprosyn and aspirin or Aspirin based products such as Excedrin, Goodys Powder, BC Powder. Stop ANY OVER THE COUNTER supplements until after surgery. STOP GLUCOSAMINE (However, you may continue taking Vitamin D, Vitamin B, and multivitamin up until the day before surgery.)  No Alcohol for 24 hours before or after surgery.  No Smoking including e-cigarettes for 24 hours prior to surgery.  No chewable tobacco  products for at least 6 hours prior to surgery.  No nicotine patches on the day of surgery.  Do not use any "recreational" drugs for at least a week prior to your surgery.  Please be advised that the combination of cocaine and anesthesia may have negative outcomes, up to and including death. If you test positive for cocaine, your surgery will be cancelled.  On the morning of surgery brush your teeth with toothpaste and water, you may rinse your mouth with mouthwash if you wish. Do not swallow any toothpaste or mouthwash.  Do not wear jewelry, make-up, hairpins, clips or nail polish.  Do not wear lotions, powders, or perfumes.   Do not shave body from the neck down 48 hours prior to surgery just in case you cut yourself which could leave a site for infection.  Also, freshly shaved skin may become irritated if using the CHG soap.  Contact lenses, hearing aids and dentures may not be worn into surgery.  Do not bring valuables to the hospital. [facility] is not responsible for any missing/lost belongings or valuables.   Use CHG Soap  as directed on instruction sheet.  Notify your doctor if there is any change in your medical condition (cold, fever, infection).  Wear comfortable clothing (specific to your surgery type) to the hospital.  Plan for stool softeners for home use; pain medications have a tendency to cause constipation. You can also help prevent constipation by eating foods high in fiber such as fruits and vegetables and drinking plenty of fluids as your diet allows.  After  surgery, you can help prevent lung complications by doing breathing exercises.  Take deep breaths and cough every 1-2 hours. Your doctor may order a device called an Incentive Spirometer to help you take deep breaths. When coughing or sneezing, hold a pillow firmly against your incision with both hands. This is called "splinting." Doing this helps protect your incision. It also decreases belly  discomfort.   If you are being discharged the day of surgery, you will not be allowed to drive home. You will need a responsible adult (18 years or older) to drive you home and stay with you that night.   If you are taking public transportation, you will need to have a responsible adult (18 years or older) with you. Please confirm with your physician that it is acceptable to use public transportation.   Please call the Pre-admissions Testing Dept. at [phone] if you have any questions about these instructions.  Visitation Policy:  Patients undergoing a surgery or procedure may have one family member or support person with them as long as that person is not COVID-19 positive or experiencing its symptoms.  That person may remain in the waiting area during the procedure.  Inpatient Visitation:    Visiting hours are 7 a.m. to 8 p.m. Patients will be allowed one visitor. The visitor may change daily. The visitor must pass COVID-19 screenings, use hand sanitizer when entering and exiting the patient's room and wear a mask at all times, including in the patient's room. Patients must also wear a mask when staff or their visitor are in the room. Masking is required regardless of vaccination status. Systemwide, no visitors 17 or younger.

## 2020-04-09 ENCOUNTER — Other Ambulatory Visit
Admission: RE | Admit: 2020-04-09 | Discharge: 2020-04-09 | Disposition: A | Discharge status: Home / Self Care | Payer: BC Managed Care – PPO | Source: Ambulatory Visit | Attending: Orthopedic Surgery | Admitting: Orthopedic Surgery

## 2020-04-09 ENCOUNTER — Other Ambulatory Visit: Payer: Self-pay

## 2020-04-09 DIAGNOSIS — Z01812 Encounter for preprocedural laboratory examination: Secondary | ICD-10-CM | POA: Insufficient documentation

## 2020-04-09 DIAGNOSIS — Z20822 Contact with and (suspected) exposure to covid-19: Secondary | ICD-10-CM | POA: Insufficient documentation

## 2020-04-09 DIAGNOSIS — Z8249 Family history of ischemic heart disease and other diseases of the circulatory system: Secondary | ICD-10-CM | POA: Diagnosis not present

## 2020-04-09 DIAGNOSIS — Z79899 Other long term (current) drug therapy: Secondary | ICD-10-CM | POA: Diagnosis not present

## 2020-04-09 DIAGNOSIS — Z472 Encounter for removal of internal fixation device: Secondary | ICD-10-CM | POA: Diagnosis present

## 2020-04-09 DIAGNOSIS — Z841 Family history of disorders of kidney and ureter: Secondary | ICD-10-CM | POA: Diagnosis not present

## 2020-04-09 DIAGNOSIS — S52572D Other intraarticular fracture of lower end of left radius, subsequent encounter for closed fracture with routine healing: Secondary | ICD-10-CM | POA: Diagnosis present

## 2020-04-09 LAB — SARS CORONAVIRUS 2 (TAT 6-24 HRS): SARS Coronavirus 2: NEGATIVE

## 2020-04-11 ENCOUNTER — Ambulatory Visit
Admission: RE | Admit: 2020-04-11 | Discharge: 2020-04-11 | Disposition: A | Discharge status: Home / Self Care | Payer: BC Managed Care – PPO | Attending: Orthopedic Surgery | Admitting: Orthopedic Surgery

## 2020-04-11 ENCOUNTER — Other Ambulatory Visit: Payer: Self-pay

## 2020-04-11 ENCOUNTER — Encounter
Admission: RE | Disposition: A | Discharge status: Home / Self Care | Payer: Self-pay | Source: Home / Self Care | Attending: Orthopedic Surgery

## 2020-04-11 ENCOUNTER — Ambulatory Visit: Payer: BC Managed Care – PPO | Admitting: Anesthesiology

## 2020-04-11 ENCOUNTER — Encounter: Payer: Self-pay | Admitting: Orthopedic Surgery

## 2020-04-11 ENCOUNTER — Ambulatory Visit: Payer: BC Managed Care – PPO

## 2020-04-11 DIAGNOSIS — Z8249 Family history of ischemic heart disease and other diseases of the circulatory system: Secondary | ICD-10-CM | POA: Insufficient documentation

## 2020-04-11 DIAGNOSIS — Z472 Encounter for removal of internal fixation device: Secondary | ICD-10-CM | POA: Insufficient documentation

## 2020-04-11 DIAGNOSIS — Z841 Family history of disorders of kidney and ureter: Secondary | ICD-10-CM | POA: Insufficient documentation

## 2020-04-11 DIAGNOSIS — Z79899 Other long term (current) drug therapy: Secondary | ICD-10-CM | POA: Insufficient documentation

## 2020-04-11 DIAGNOSIS — Z20822 Contact with and (suspected) exposure to covid-19: Secondary | ICD-10-CM | POA: Insufficient documentation

## 2020-04-11 HISTORY — PX: HARDWARE REMOVAL: SHX979

## 2020-04-11 SURGERY — REMOVAL, HARDWARE
Anesthesia: General | Laterality: Left

## 2020-04-11 MED ORDER — CEFAZOLIN SODIUM-DEXTROSE 2-4 GM/100ML-% IV SOLN
INTRAVENOUS | Status: AC
  Filled 2020-04-11: qty 100

## 2020-04-11 MED ORDER — FENTANYL CITRATE (PF) 100 MCG/2ML IJ SOLN
INTRAMUSCULAR | Status: DC | PRN
  Administered 2020-04-11: 50 ug via INTRAVENOUS
  Administered 2020-04-11: 25 ug via INTRAVENOUS
  Administered 2020-04-11: 50 ug via INTRAVENOUS
  Administered 2020-04-11: 25 ug via INTRAVENOUS
  Administered 2020-04-11 (×3): 50 ug via INTRAVENOUS

## 2020-04-11 MED ORDER — DEXAMETHASONE SODIUM PHOSPHATE 10 MG/ML IJ SOLN
INTRAMUSCULAR | Status: DC | PRN
  Administered 2020-04-11: 10 mg via INTRAVENOUS

## 2020-04-11 MED ORDER — ACETAMINOPHEN 10 MG/ML IV SOLN
INTRAVENOUS | Status: DC | PRN
  Administered 2020-04-11: 1000 mg via INTRAVENOUS

## 2020-04-11 MED ORDER — ACETAMINOPHEN 10 MG/ML IV SOLN
INTRAVENOUS | Status: AC
  Filled 2020-04-11: qty 100

## 2020-04-11 MED ORDER — OXYCODONE HCL 5 MG PO TABS: 5.0000 mg | ORAL_TABLET | Freq: Three times a day (TID) | ORAL | Status: DC | PRN

## 2020-04-11 MED ORDER — DEXMEDETOMIDINE (PRECEDEX) IN NS 20 MCG/5ML (4 MCG/ML) IV SYRINGE
PREFILLED_SYRINGE | INTRAVENOUS | Status: AC
  Filled 2020-04-11: qty 5

## 2020-04-11 MED ORDER — OXYCODONE HCL 5 MG PO TABS
5.0000 mg | ORAL_TABLET | Freq: Three times a day (TID) | ORAL | 0 refills | Status: AC | PRN
Start: 2020-04-11 — End: 2021-04-11

## 2020-04-11 MED ORDER — PROPOFOL 10 MG/ML IV BOLUS
INTRAVENOUS | Status: AC
  Filled 2020-04-11: qty 20

## 2020-04-11 MED ORDER — PROMETHAZINE HCL 25 MG/ML IJ SOLN: 6.2500 mg | INTRAMUSCULAR | Status: DC | PRN

## 2020-04-11 MED ORDER — SODIUM CHLORIDE 0.9 % IV SOLN: INTRAVENOUS | Status: DC

## 2020-04-11 MED ORDER — HYDROMORPHONE HCL 1 MG/ML IJ SOLN
INTRAMUSCULAR | Status: AC
  Administered 2020-04-11: 0.5 mg via INTRAVENOUS
  Filled 2020-04-11: qty 1

## 2020-04-11 MED ORDER — FAMOTIDINE 20 MG PO TABS
ORAL_TABLET | ORAL | Status: AC
  Filled 2020-04-11: qty 1

## 2020-04-11 MED ORDER — CEFAZOLIN SODIUM-DEXTROSE 2-4 GM/100ML-% IV SOLN
2.0000 g | INTRAVENOUS | Status: AC
  Administered 2020-04-11: 2 g via INTRAVENOUS

## 2020-04-11 MED ORDER — MIDAZOLAM HCL 2 MG/2ML IJ SOLN
INTRAMUSCULAR | Status: AC
  Filled 2020-04-11: qty 2

## 2020-04-11 MED ORDER — LIDOCAINE HCL (PF) 2 % IJ SOLN
INTRAMUSCULAR | Status: AC
  Filled 2020-04-11: qty 5

## 2020-04-11 MED ORDER — ONDANSETRON HCL 4 MG/2ML IJ SOLN: 4.0000 mg | Freq: Four times a day (QID) | INTRAMUSCULAR | Status: DC | PRN

## 2020-04-11 MED ORDER — PROPOFOL 10 MG/ML IV BOLUS
INTRAVENOUS | Status: DC | PRN
  Administered 2020-04-11: 150 mg via INTRAVENOUS

## 2020-04-11 MED ORDER — KETOROLAC TROMETHAMINE 30 MG/ML IJ SOLN
INTRAMUSCULAR | Status: AC
  Filled 2020-04-11: qty 1

## 2020-04-11 MED ORDER — LIDOCAINE HCL (CARDIAC) PF 100 MG/5ML IV SOSY
PREFILLED_SYRINGE | INTRAVENOUS | Status: DC | PRN
  Administered 2020-04-11: 40 mg via INTRAVENOUS
  Administered 2020-04-11: 60 mg via INTRAVENOUS

## 2020-04-11 MED ORDER — FENTANYL CITRATE (PF) 100 MCG/2ML IJ SOLN
50.0000 ug | Freq: Once | INTRAMUSCULAR | Status: AC
  Administered 2020-04-11: 50 ug via INTRAVENOUS

## 2020-04-11 MED ORDER — CHLORHEXIDINE GLUCONATE 0.12 % MT SOLN
15.0000 mL | Freq: Once | OROMUCOSAL | Status: AC
  Administered 2020-04-11: 15 mL via OROMUCOSAL

## 2020-04-11 MED ORDER — METOCLOPRAMIDE HCL 5 MG/ML IJ SOLN: 5.0000 mg | Freq: Three times a day (TID) | INTRAMUSCULAR | Status: DC | PRN

## 2020-04-11 MED ORDER — ORAL CARE MOUTH RINSE: 15.0000 mL | Freq: Once | OROMUCOSAL | Status: AC

## 2020-04-11 MED ORDER — FENTANYL CITRATE (PF) 100 MCG/2ML IJ SOLN
INTRAMUSCULAR | Status: AC
  Administered 2020-04-11: 25 ug via INTRAVENOUS
  Filled 2020-04-11: qty 2

## 2020-04-11 MED ORDER — CHLORHEXIDINE GLUCONATE 0.12 % MT SOLN
OROMUCOSAL | Status: AC
  Filled 2020-04-11: qty 15

## 2020-04-11 MED ORDER — OXYCODONE HCL 5 MG PO TABS
ORAL_TABLET | ORAL | Status: AC
  Administered 2020-04-11: 5 mg via ORAL
  Filled 2020-04-11: qty 1

## 2020-04-11 MED ORDER — ONDANSETRON HCL 4 MG/2ML IJ SOLN
INTRAMUSCULAR | Status: AC
  Filled 2020-04-11: qty 2

## 2020-04-11 MED ORDER — FENTANYL CITRATE (PF) 100 MCG/2ML IJ SOLN
INTRAMUSCULAR | Status: AC
  Filled 2020-04-11: qty 2

## 2020-04-11 MED ORDER — DEXMEDETOMIDINE (PRECEDEX) IN NS 20 MCG/5ML (4 MCG/ML) IV SYRINGE
PREFILLED_SYRINGE | INTRAVENOUS | Status: DC | PRN
  Administered 2020-04-11: 20 ug via INTRAVENOUS

## 2020-04-11 MED ORDER — ONDANSETRON HCL 4 MG PO TABS: 4.0000 mg | ORAL_TABLET | Freq: Four times a day (QID) | ORAL | Status: DC | PRN

## 2020-04-11 MED ORDER — DEXAMETHASONE SODIUM PHOSPHATE 10 MG/ML IJ SOLN
INTRAMUSCULAR | Status: AC
  Filled 2020-04-11: qty 1

## 2020-04-11 MED ORDER — KETOROLAC TROMETHAMINE 30 MG/ML IJ SOLN
INTRAMUSCULAR | Status: DC | PRN
  Administered 2020-04-11: 30 mg via INTRAVENOUS

## 2020-04-11 MED ORDER — ONDANSETRON HCL 4 MG/2ML IJ SOLN
INTRAMUSCULAR | Status: DC | PRN
  Administered 2020-04-11: 4 mg via INTRAVENOUS

## 2020-04-11 MED ORDER — FAMOTIDINE 20 MG PO TABS
20.0000 mg | ORAL_TABLET | Freq: Once | ORAL | Status: AC
  Administered 2020-04-11: 20 mg via ORAL

## 2020-04-11 MED ORDER — LACTATED RINGERS IV SOLN: INTRAVENOUS | Status: DC

## 2020-04-11 MED ORDER — METOCLOPRAMIDE HCL 10 MG PO TABS: 5.0000 mg | ORAL_TABLET | Freq: Three times a day (TID) | ORAL | Status: DC | PRN

## 2020-04-11 MED ORDER — FENTANYL CITRATE (PF) 100 MCG/2ML IJ SOLN
25.0000 ug | INTRAMUSCULAR | Status: DC | PRN
  Administered 2020-04-11: 25 ug via INTRAVENOUS
  Administered 2020-04-11: 50 ug via INTRAVENOUS
  Administered 2020-04-11: 25 ug via INTRAVENOUS

## 2020-04-11 MED ORDER — NEOMYCIN-POLYMYXIN B GU 40-200000 IR SOLN
Status: AC
  Filled 2020-04-11: qty 2

## 2020-04-11 MED ORDER — HYDROMORPHONE HCL 1 MG/ML IJ SOLN
0.5000 mg | INTRAMUSCULAR | Status: AC | PRN
  Administered 2020-04-11 (×2): 0.5 mg via INTRAVENOUS

## 2020-04-11 SURGICAL SUPPLY — 40 items
APL PRP STRL LF DISP 70% ISPRP (MISCELLANEOUS) ×1
CANISTER SUCT 1200ML W/VALVE (MISCELLANEOUS) ×2 IMPLANT
CHLORAPREP W/TINT 26 (MISCELLANEOUS) ×2 IMPLANT
COVER WAND RF STERILE (DRAPES) ×2 IMPLANT
CUFF TOURN SGL QUICK 24 (TOURNIQUET CUFF)
CUFF TOURN SGL QUICK 30 (TOURNIQUET CUFF)
CUFF TRNQT CYL 24X4X16.5-23 (TOURNIQUET CUFF) IMPLANT
CUFF TRNQT CYL 30X4X21-28X (TOURNIQUET CUFF) IMPLANT
DRAPE C-ARM XRAY 36X54 (DRAPES) ×2 IMPLANT
DRAPE INCISE IOBAN 66X45 STRL (DRAPES) ×2 IMPLANT
DRSG EMULSION OIL 3X8 NADH (GAUZE/BANDAGES/DRESSINGS) ×2 IMPLANT
ELECT CAUTERY BLADE 6.4 (BLADE) ×2 IMPLANT
ELECT REM PT RETURN 9FT ADLT (ELECTROSURGICAL) ×2
ELECTRODE REM PT RTRN 9FT ADLT (ELECTROSURGICAL) ×1 IMPLANT
GAUZE SPONGE 4X4 12PLY STRL (GAUZE/BANDAGES/DRESSINGS) ×2 IMPLANT
GAUZE XEROFORM 1X8 LF (GAUZE/BANDAGES/DRESSINGS) ×2 IMPLANT
GLOVE SURG SYN 9.0  PF PI (GLOVE) ×1
GLOVE SURG SYN 9.0 PF PI (GLOVE) ×1 IMPLANT
GOWN SRG 2XL LVL 4 RGLN SLV (GOWNS) ×1 IMPLANT
GOWN STRL NON-REIN 2XL LVL4 (GOWNS) ×2
GOWN STRL REUS W/ TWL LRG LVL3 (GOWN DISPOSABLE) ×1 IMPLANT
GOWN STRL REUS W/TWL LRG LVL3 (GOWN DISPOSABLE) ×2
KIT TURNOVER KIT A (KITS) ×2 IMPLANT
MANIFOLD NEPTUNE II (INSTRUMENTS) ×2 IMPLANT
NDL FILTER BLUNT 18X1 1/2 (NEEDLE) ×1 IMPLANT
NEEDLE FILTER BLUNT 18X 1/2SAF (NEEDLE) ×1
NEEDLE FILTER BLUNT 18X1 1/2 (NEEDLE) ×1 IMPLANT
NS IRRIG 1000ML POUR BTL (IV SOLUTION) ×2 IMPLANT
PACK EXTREMITY ARMC (MISCELLANEOUS) ×2 IMPLANT
PAD ABD DERMACEA PRESS 5X9 (GAUZE/BANDAGES/DRESSINGS) ×4 IMPLANT
SCALPEL PROTECTED #15 DISP (BLADE) ×4 IMPLANT
STAPLER SKIN PROX 35W (STAPLE) ×2 IMPLANT
SUT ETHIBOND NAB CT1 #1 30IN (SUTURE) ×2 IMPLANT
SUT ETHILON 3-0 FS-10 30 BLK (SUTURE) ×2
SUT VIC AB 0 CT1 36 (SUTURE) ×2 IMPLANT
SUT VIC AB 2-0 CT1 27 (SUTURE) ×2
SUT VIC AB 2-0 CT1 TAPERPNT 27 (SUTURE) ×1 IMPLANT
SUTURE EHLN 3-0 FS-10 30 BLK (SUTURE) ×1 IMPLANT
SYR 10ML LL (SYRINGE) ×2 IMPLANT
WATER STERILE IRR 1000ML POUR (IV SOLUTION) ×2 IMPLANT

## 2020-04-11 NOTE — Transfer of Care (Signed)
Immediate Anesthesia Transfer of Care Note  Patient: James Boyer  Procedure(s) Performed: HARDWARE REMOVAL (Left )  Patient Location: PACU  Anesthesia Type:General  Level of Consciousness: drowsy and patient cooperative  Airway & Oxygen Therapy: Patient Spontanous Breathing and Patient connected to face mask oxygen  Post-op Assessment: Report given to RN and Post -op Vital signs reviewed and stable  Post vital signs: Reviewed and stable  Last Vitals:  Vitals Value Taken Time  BP 142/109 04/11/20 0901  Temp 36.2 C 04/11/20 0856  Pulse 112 04/11/20 0902  Resp 15 04/11/20 0902  SpO2 100 % 04/11/20 0902  Vitals shown include unvalidated device data.  Last Pain:  Vitals:   04/11/20 0856  TempSrc:   PainSc: 8          Complications: No complications documented.

## 2020-04-11 NOTE — H&P (Signed)
Chief Complaint  Patient presents with  . Post Operative Visit  S/P ORIF Lt Wrist    History of the Present Illness: James Boyer is a 40 y.o. male here today.   The patient presents for follow-up evaluation status post left wrist ORIF. He had dorsal and volar plating. He is having quite a bit of stiffness. I got a message from therapy yesterday that he is having difficulty with flexion and extension. He comes back today for recheck.  The patient states his left wrist looks much better. There are still some things he has difficulty doing due to the plating. He states the scab on his left wrist has resolved.  The patient is not able to do his job yet.   I have reviewed past medical, surgical, social and family history, and allergies as documented in the EMR.  Past Medical History: Past Medical History:  Diagnosis Date  . Allergy  . Anxiety  . Arthritis  . Psoriasis  as a child   Past Surgical History: Past Surgical History:  Procedure Laterality Date  . APPENDECTOMY  . C6-7 ANTERIOR CERVICAL DISCECTOMY AND FUSION 01/19/2020  Dr. Venetia Night @ [facility], [place]  . CYSTECTOMY  . FRACTURE SURGERY Left  COLLAR BONE  . ORIF left wrist Left 01/11/2020  Dr Rosita Kea   Past Family History: Family History  Problem Relation Age of Onset  . High blood pressure (Hypertension) Mother  . Kidney disease Mother  . Lung cancer Father  . Dementia Maternal Grandmother  . Dementia Paternal Grandmother   Medications: Current Outpatient Medications Ordered in Epic  Medication Sig Dispense Refill  . escitalopram oxalate (LEXAPRO) 10 MG tablet Take 1 tablet (10 mg total) by mouth once daily (Patient taking differently: Take 5 mg by mouth once daily ) 30 tablet 6  . glucosamine sulfate (GLUCOSAMINE ORAL) Take 1 tablet by mouth once daily  . multivit-min/folic/vit K/lycop (MEN'S MULTIVITAMIN ORAL) Take 1 tablet by mouth once daily  . HYDROcodone-acetaminophen (NORCO) 10-325 mg tablet Take 1  tablet by mouth every 6 (six) hours as needed 30 tablet 0  . ibuprofen (MOTRIN) 200 MG tablet Take 200 mg by mouth every 6 (six) hours as needed for Pain (Patient not taking: Reported on 02/28/2020 )  . methocarbamoL (ROBAXIN) 500 MG tablet Take 500 mg by mouth 4 (four) times daily (Patient not taking: Reported on 02/28/2020 )  . oxyCODONE (ROXICODONE) 5 MG immediate release tablet  . tiZANidine (ZANAFLEX) 2 MG tablet Take 1 tablet (2 mg total) by mouth nightly as needed (Patient not taking: Reported on 02/28/2020 ) 30 tablet 1   No current Epic-ordered facility-administered medications on file.   Allergies: No Known Allergies   Body mass index is 24.39 kg/m.  Review of Systems: A comprehensive 14 point ROS was performed, reviewed, and the pertinent orthopaedic findings are documented in the HPI.  Vitals:  03/20/20 0842  BP: 126/80    General Physical Examination:   General/Constitutional: No apparent distress: well-nourished and well developed. Eyes: Pupils equal, round with synchronous movement. Lungs: Clear to auscultation HEENT: Normal Vascular: No edema, swelling or tenderness, except as noted in detailed exam. Cardiac: Heart rate and rhythm is regular. Integumentary: No impressive skin lesions present, except as noted in detailed exam. Neuro/Psych: Normal mood and affect, oriented to person, place and time.  Musculoskeletal Examination:  On exam, left wrist scar is healing well.  Radiographs:  AP, lateral, and oblique x-rays of the left wrist were ordered and personally reviewed today. These  show partial healing, although incomplete of a comminuted intra-articular fracture. Hardware all intact. He has dorsal, volar, and radial styloid plates.  X-ray Impression Stable appearance, not yet completely healed, with near anatomic alignment.2  Assessment: ICD-10-CM  1. S/P ORIF (open reduction internal fixation) fracture Z98.890  Z87.81  2. Other closed intra-articular  fracture of distal end of left radius with routine healing, subsequent encounter S52.572D   Plan:  The patient has clinical findings of stable appearance, not yet completely healed with near anatomic alignment of left wrist status post ORIF.  We discussed the patient's x-ray findings. I explained he is healing well, but I am still seeing a fracture line. I recommend left wrist hardware removal in approximately 3 weeks. I explained the surgery and postoperative course in detail. The patient will hold off on physical therapy until after his hardware removal. I renewed his order for physical therapy.  We will schedule the patient for left wrist hardware removal in approximately 3 weeks.  Surgical Risks:  The nature of the condition and the proposed procedure has been reviewed in detail with the patient. Surgical versus non-surgical options and prognosis for recovery have been reviewed and the inherent risks and benefits of each have been discussed including the risks of infection, bleeding, injury to nerves/blood vessels/tendons, incomplete relief of symptoms, persisting pain and/or stiffness, loss of function, complex regional pain syndrome, failure of the procedure, as appropriate.  Teeth: normal  Attestation: I, Dawn Royse, am documenting for [facility], MD utilizing Nuance DAX.     Electronically signed by Marlena Clipper, MD at 03/20/2020 9:08 PM EST    Reviewed  H+P. No changes noted.

## 2020-04-11 NOTE — Anesthesia Procedure Notes (Signed)
Procedure Name: LMA Insertion Performed by: Allenmichael Mcpartlin, CRNA Pre-anesthesia Checklist: Patient identified, Patient being monitored, Timeout performed, Emergency Drugs available and Suction available Patient Re-evaluated:Patient Re-evaluated prior to induction Oxygen Delivery Method: Circle system utilized Preoxygenation: Pre-oxygenation with 100% oxygen Induction Type: IV induction Ventilation: Mask ventilation without difficulty LMA: LMA inserted LMA Size: 4.0 Tube type: Oral Number of attempts: 1 Placement Confirmation: positive ETCO2 and breath sounds checked- equal and bilateral Tube secured with: Tape Dental Injury: Teeth and Oropharynx as per pre-operative assessment        

## 2020-04-11 NOTE — Op Note (Signed)
04/11/2020  8:51 AM  PATIENT:  James Boyer  40 y.o. male  PRE-OPERATIVE DIAGNOSIS:  S/P ORIF fracture Z98.890, Z87.81 Other closed intra-articular fracture of distal end of left radius with routine healing, subsequent encounter S52.572D  POST-OPERATIVE DIAGNOSIS:  S/P ORIF fracture Z98.890, Z87.81 Other closed intra-articular fracture of distal end of left radius with routine healing, subsequent encounter S52.572D  PROCEDURE:  Procedure(s): HARDWARE REMOVAL (Left)  SURGEON: Leitha Schuller, MD  ASSISTANTS: None  ANESTHESIA:   general  EBL:  Total I/O In: 200 [IV Piggyback:200] Out: -   BLOOD ADMINISTERED:none  DRAINS: none   LOCAL MEDICATIONS USED:  NONE  SPECIMEN:  No Specimen  DISPOSITION OF SPECIMEN:  N/A  COUNTS:  YES  TOURNIQUET:   Total Tourniquet Time Documented: area (laterality) - 60 minutes Total: area (laterality) - 60 minutes   IMPLANTS: 58 minutes at 250 mmHg  DICTATION: .Dragon Dictation patient was brought to the operating room and after adequate general anesthesia was obtained the left arm was prepped and draped in the usual sterile fashion with a tourniquet applied the upper arm. After patient identification and timeout procedure tourniquet was raised. The volar incision was opened first going through the prior scar with extensive scar tissue present over the FCR tendon. The tendon was freed up and then the plate exposed without difficulty with the median nerve observed and protected. The distal screw removed with proximal screw was then removed and the entire plate removed without complication with mild flexor tenosynovitis present. The wound was irrigated and then attention turned dorsally where the prior dorsal incision was opened with care being taken to protect the extensor tendons the ulnar dorsal plate was exposed and all screws removed with removal of plate. Going radially the radial plate was exposed with careful dissection elevating the tendon  off the plate all screws removed and the plate removed. The wounds were thoroughly irrigated and closed with 3-0 Vicryl subcutaneously and 4-0 nylon in a horizontal mattress fashion. Xeroform 4 x 4's web roll and a Ace wrap applied. Tourniquet let down the close of the case no complications  PLAN OF CARE: Discharge to home after PACU  PATIENT DISPOSITION:  PACU - hemodynamically stable.

## 2020-04-11 NOTE — Discharge Instructions (Signed)
Outpatient Surgery, Adult An outpatient surgery is a procedure that does not require an overnight stay at a hospital or clinic. A person having an outpatient surgery can go home hours after the surgery is complete. Tell a health care provider about:  Any allergies you have.  All medicines you are taking, including vitamins, supplements, herbs, eye drops, creams, and over-the-counter medicines.  Any problems you or family members have had with anesthetic medicines.  Any blood disorders you have.  Any surgeries you have had.  Any medical conditions you have.  Any use of tobacco products, alcohol, or drugs, including marijuana.  Whether you are pregnant or may be pregnant. What are the risks? The risks and complications of surgery depend on the specific procedure. Common risks and complications include:  Infection.  Bleeding.  Blood clots in the legs or lungs.  Allergic reactions to medicines or dyes.  Damage to nearby structures or organs.  Temporary increase in pain.  Failure to fix the problem that the surgery was meant to fix. What happens before the procedure? Staying hydrated Follow instructions from your health care provider about hydration, which may include:  Up to 2 hours before the procedure - you may continue to drink clear liquids, such as water, clear fruit juice, black coffee, and plain tea.   Eating and drinking restrictions Follow instructions from your health care provider about eating and drinking, which may include:  8 hours before the procedure - stop eating heavy meals or foods, such as meat, fried foods, or fatty foods.  6 hours before the procedure - stop eating light meals or foods, such as toast or cereal.  6 hours before the procedure - stop drinking milk or drinks that contain milk.  2 hours before the procedure - stop drinking clear liquids. Medicines  Ask your health care provider about: ? Changing or stopping your regular medicines.  This is especially important if you are taking diabetes medicines or blood thinners. ? Taking medicines such as aspirin and ibuprofen. These medicines can thin your blood. Do not take these medicines unless your health care provider tells you to take them. ? Taking over-the-counter medicines, vitamins, herbs, and supplements.  You may be asked to take medicines that help you have a bowel movement (laxatives) or to take antibiotics to help prevent infection.   General instructions  Do not use any products that contain nicotine or tobacco for at least 4 weeks before the procedure. These products include cigarettes, e-cigarettes, and chewing tobacco. If you need help quitting, ask your health care provider.  If told by your health care provider, bring your sleep apnea device with you on the day of your surgery.  Plan to have a responsible adult take you home from the hospital or clinic.  Plan to have a responsible adult care for you for the time you are told after you leave the hospital or clinic. This is important.  Ask your health care provider: ? How your surgery site will be marked. ? What steps will be taken to help prevent infection. These may include:  Removing hair at the surgery site.  Washing skin with a germ-killing soap.  Receiving an antibiotic medicine.  Call your health care provider if you develop an illness or a problem that may prevent you from safely having your procedure.  You may have an exam or testing.   What happens during the procedure?  An IV will be inserted into one of your veins.  You may be  given one or more of the following: ? A medicine to help you relax (sedative). ? A medicine to numb the area (local anesthetic). ? A medicine to make you fall asleep (general anesthetic). ? A medicine that is injected into your spine to numb the area below and slightly above the injection site (spinal anesthetic). ? A medicine that is injected into an area of your  body to numb everything below the injection site (regional anesthetic).  If any incisions are made during the procedure, they will be closed with stitches (sutures), staples, skin glue, or adhesive strips.  A bandage (dressing) may be applied over any sutures or other closures. The specifics of the procedure will depend on the type of procedure that you are having. The procedure may also vary among health care providers and hospitals. What happens after the procedure?  Your blood pressure, heart rate, breathing rate, and blood oxygen level will be monitored until you leave the hospital or clinic.  Do not drive or operate machinery until your health care provider says that it is safe.  If there are no complications, you will be allowed to go home with a responsible adult when you are awake, stable, and taking fluids well.  The surgical site will feel tender.  You may feel nauseous and have some swelling, bruising, and numbness around the surgical site.   Summary  An outpatient surgery is a procedure that does not require an overnight stay at a hospital or clinic. A person having an outpatient surgery can go home hours after the surgery is complete.  Follow instructions from your health care provider about eating and drinking before your surgery.  Ask your health care provider if you need to take any new medicines before surgery or if you need to change or stop your regular medicines.  Do not use any products that contain nicotine or tobacco for at least 4 weeks before the procedure.  Plan to have a responsible adult care for you for the time you are told after you leave the hospital or clinic. This is important. This information is not intended to replace advice given to you by your health care provider. Make sure you discuss any questions you have with your health care provider. Document Revised: 06/09/2019 Document Reviewed: 12/01/2018 Elsevier Patient Education  2021 Elsevier  Inc. Orthopedic Hardware Removal, Care After This sheet gives you information about how to care for yourself after your procedure. Your health care provider may also give you more specific instructions. If you have problems or questions, contact your health care provider. What can I expect after the procedure? After the procedure, it is common to have:  Soreness or pain.  Some swelling in the area where the hardware was removed.  A small amount of blood or clear fluid coming from your incision. Follow these instructions at home: If you have a cast:  Do not stick anything inside the cast to scratch your skin. Doing that increases your risk of infection.  Check the skin around the cast every day. Tell your health care provider about any concerns.  You may put lotion on dry skin around the edges of the cast. Do not put lotion on the skin underneath the cast.  Keep the cast clean and dry. If you have a splint or boot:  Wear the splint or boot as told by your health care provider. Remove it only as told by your health care provider.  Loosen the splint or boot if your fingers  or toes tingle, become numb, or turn cold and blue.  Keep the splint or boot clean and dry. Bathing  Do not take baths, swim, or use a hot tub until your health care provider approves. Ask your health care provider if you may take showers. You may only be allowed to take sponge baths.  Keep the bandage (dressing) dry until your health care provider says it can be removed.  If your cast, splint, or boot is not waterproof: ? Do not let it get wet. ? Cover it with a watertight covering when you take a bath or a shower. Incision care  Follow instructions from your health care provider about how to take care of your incision. Make sure you: ? Wash your hands with soap and water before you change your dressing. If soap and water are not available, use hand sanitizer. ? Change your dressing as told by your health  care provider. ? Leave stitches (sutures), skin glue, or adhesive strips in place. These skin closures may need to stay in place for 2 weeks or longer. If adhesive strip edges start to loosen and curl up, you may trim the loose edges. Do not remove adhesive strips completely unless your health care provider tells you to do that.  Check your incision area every day for signs of infection. Check for: ? Redness. ? More swelling or pain. ? More fluid or blood. ? Warmth. ? Pus or a bad smell.   Managing pain, stiffness, and swelling  If directed, put ice on the affected area: ? If you have a removable splint or boot, remove it as told by your health care provider. ? Put ice in a plastic bag. ? Place a towel between your skin and the bag. ? Leave the ice on for 20 minutes, 2-3 times a day.  Move your fingers or toes often to avoid stiffness and to lessen swelling.  Raise (elevate) the injured area above the level of your heart while you are sitting or lying down.   Driving  Do not drive or use heavy machinery while taking prescription pain medicine.  Do not drive for 24 hours if you were given a medicine to help you relax (sedative) during your procedure.  Ask your health care provider when it is safe to drive if you have a cast, splint, or boot on the affected limb. Activity  Ask your health care provider what activities are safe for you during recovery, and ask what activities you need to avoid.  Do not use the injured limb to support your body weight until your health care provider says that you can.  Do not play contact sports until your health care provider approves.  Do exercises as told by your health care provider.  Avoid sitting for a long time without moving. Get up and move around at least every few hours. This will help prevent blood clots. General instructions  Do not put pressure on any part of the cast or splint until it is fully hardened. This may take several  hours.  If you are taking prescription pain medicine, take actions to prevent or treat constipation. Your health care provider may recommend that you: ? Drink enough fluid to keep your urine pale yellow. ? Eat foods that are high in fiber, such as fresh fruits and vegetables, whole grains, and beans. ? Limit foods that are high in fat and processed sugars, such as fried or sweet foods. ? Take an over-the-counter or prescription medicine for  constipation.  Do not use any products that contain nicotine or tobacco, such as cigarettes and e-cigarettes. These can delay bone healing after surgery. If you need help quitting, ask your health care provider.  Take over-the-counter and prescription medicines only as told by your health care provider.  Keep all follow-up visits as told by your health care provider. This is important. Contact a health care provider if:  You have lasting pain.  You have redness around your incision.  You have more swelling or pain around your incision.  You have more fluid or blood coming from your incision.  Your incision feels warm to the touch.  You have pus or a bad smell coming from your incision.  You are unable to do exercises or physical activity as told by your health care provider. Get help right away if:  You have difficulty breathing.  You have chest pain.  You have severe pain.  You have a fever or chills.  You have numbness for more than 24 hours in the area where the hardware was removed. Summary  After the procedure, it is common to have some pain and swelling in the area where the hardware was removed.  Follow instructions from your health care provider about how to take care of your incision.  Return to your normal activities as told by your health care provider. Ask your health care provider what activities are safe for you. This information is not intended to replace advice given to you by your health care provider. Make sure you  discuss any questions you have with your health care provider. Document Revised: 04/07/2018 Document Reviewed: 03/04/2017 Elsevier Patient Education  2021 Elsevier Inc. Keep dressing clean and dry. Elevate arm as much as you can through the weekend. Work on finger range of motion is much as you can. Pain medicine as directed. Call office if you are having problems.
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
## 2020-04-11 NOTE — Anesthesia Postprocedure Evaluation (Signed)
Anesthesia Post Note  Patient: James Boyer  Procedure(s) Performed: HARDWARE REMOVAL (Left )  Patient location during evaluation: PACU Anesthesia Type: General Level of consciousness: awake and alert Pain management: pain level controlled Vital Signs Assessment: post-procedure vital signs reviewed and stable Respiratory status: spontaneous breathing, nonlabored ventilation, respiratory function stable and patient connected to nasal cannula oxygen Cardiovascular status: blood pressure returned to baseline and stable Postop Assessment: no apparent nausea or vomiting Anesthetic complications: no   No complications documented.   Last Vitals:  Vitals:   04/11/20 1033 04/11/20 1055  BP: (!) 133/93 (!) 127/91  Pulse: 86 90  Resp: 15 17  Temp: (!) 36.4 C 36.4 C  SpO2: 100% 97%    Last Pain:  Vitals:   04/11/20 1055  TempSrc: Temporal  PainSc: 3                  Lenard Simmer

## 2020-04-12 ENCOUNTER — Encounter: Payer: Self-pay | Admitting: Orthopedic Surgery

## 2020-04-30 ENCOUNTER — Ambulatory Visit: Payer: BC Managed Care – PPO | Attending: Orthopedic Surgery | Admitting: Occupational Therapy

## 2020-04-30 ENCOUNTER — Other Ambulatory Visit: Payer: Self-pay

## 2020-04-30 ENCOUNTER — Encounter: Payer: Self-pay | Admitting: Occupational Therapy

## 2020-04-30 DIAGNOSIS — M25642 Stiffness of left hand, not elsewhere classified: Secondary | ICD-10-CM | POA: Diagnosis present

## 2020-04-30 DIAGNOSIS — M6281 Muscle weakness (generalized): Secondary | ICD-10-CM | POA: Insufficient documentation

## 2020-04-30 DIAGNOSIS — L905 Scar conditions and fibrosis of skin: Secondary | ICD-10-CM | POA: Insufficient documentation

## 2020-04-30 DIAGNOSIS — M25632 Stiffness of left wrist, not elsewhere classified: Secondary | ICD-10-CM | POA: Diagnosis present

## 2020-04-30 DIAGNOSIS — M79642 Pain in left hand: Secondary | ICD-10-CM | POA: Insufficient documentation
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
## 2020-04-30 NOTE — Patient Instructions (Signed)
Moist heat -scar massage - mobs more from side  PROM and AAROM over edge of table for wrist flexion , ext, RD, UD  AROM for sup /pro, RD, UD  12 reps pain free  PROM and prolonged flexion stretch MC of digits Composite flexion stretch for digits to palm - And then maintain 90 degrees MC flexion - AROM to palm for composite flexion  Focus on maintaining wrist neutral   2-3 x day

## 2020-05-07 ENCOUNTER — Other Ambulatory Visit: Payer: Self-pay

## 2020-05-07 ENCOUNTER — Ambulatory Visit: Payer: BC Managed Care – PPO | Admitting: Occupational Therapy

## 2020-05-07 DIAGNOSIS — M6281 Muscle weakness (generalized): Secondary | ICD-10-CM | POA: Diagnosis not present

## 2020-05-07 DIAGNOSIS — M25632 Stiffness of left wrist, not elsewhere classified: Secondary | ICD-10-CM

## 2020-05-07 DIAGNOSIS — L905 Scar conditions and fibrosis of skin: Secondary | ICD-10-CM

## 2020-05-07 DIAGNOSIS — M25642 Stiffness of left hand, not elsewhere classified: Secondary | ICD-10-CM

## 2020-05-07 DIAGNOSIS — M79642 Pain in left hand: Secondary | ICD-10-CM
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
## 2020-05-09 ENCOUNTER — Ambulatory Visit: Payer: BC Managed Care – PPO | Admitting: Occupational Therapy

## 2020-05-14 ENCOUNTER — Ambulatory Visit: Payer: BC Managed Care – PPO | Admitting: Occupational Therapy

## 2020-05-14 ENCOUNTER — Other Ambulatory Visit: Payer: Self-pay

## 2020-05-14 DIAGNOSIS — L905 Scar conditions and fibrosis of skin: Secondary | ICD-10-CM

## 2020-05-14 DIAGNOSIS — M6281 Muscle weakness (generalized): Secondary | ICD-10-CM

## 2020-05-14 DIAGNOSIS — M79642 Pain in left hand: Secondary | ICD-10-CM

## 2020-05-14 DIAGNOSIS — M25642 Stiffness of left hand, not elsewhere classified: Secondary | ICD-10-CM

## 2020-05-14 DIAGNOSIS — M25632 Stiffness of left wrist, not elsewhere classified: Secondary | ICD-10-CM
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
## 2020-05-16 ENCOUNTER — Other Ambulatory Visit: Payer: Self-pay

## 2020-05-16 ENCOUNTER — Ambulatory Visit: Payer: BC Managed Care – PPO | Admitting: Occupational Therapy

## 2020-05-16 DIAGNOSIS — M6281 Muscle weakness (generalized): Secondary | ICD-10-CM

## 2020-05-16 DIAGNOSIS — M79642 Pain in left hand: Secondary | ICD-10-CM

## 2020-05-16 DIAGNOSIS — L905 Scar conditions and fibrosis of skin: Secondary | ICD-10-CM

## 2020-05-16 DIAGNOSIS — M25632 Stiffness of left wrist, not elsewhere classified: Secondary | ICD-10-CM

## 2020-05-16 DIAGNOSIS — M25642 Stiffness of left hand, not elsewhere classified: Secondary | ICD-10-CM
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
## 2020-05-23 ENCOUNTER — Ambulatory Visit: Payer: BC Managed Care – PPO | Admitting: Occupational Therapy

## 2020-05-28 ENCOUNTER — Other Ambulatory Visit: Payer: Self-pay

## 2020-05-28 ENCOUNTER — Ambulatory Visit: Payer: BC Managed Care – PPO | Attending: Orthopedic Surgery | Admitting: Occupational Therapy

## 2020-05-28 DIAGNOSIS — M25642 Stiffness of left hand, not elsewhere classified: Secondary | ICD-10-CM | POA: Diagnosis present

## 2020-05-28 DIAGNOSIS — M6281 Muscle weakness (generalized): Secondary | ICD-10-CM | POA: Diagnosis present

## 2020-05-28 DIAGNOSIS — L905 Scar conditions and fibrosis of skin: Secondary | ICD-10-CM | POA: Diagnosis present

## 2020-05-28 DIAGNOSIS — M79642 Pain in left hand: Secondary | ICD-10-CM | POA: Insufficient documentation

## 2020-05-28 DIAGNOSIS — M25632 Stiffness of left wrist, not elsewhere classified: Secondary | ICD-10-CM | POA: Insufficient documentation
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
## 2020-06-04 ENCOUNTER — Encounter: Payer: Self-pay | Admitting: Occupational Therapy

## 2020-06-07 ENCOUNTER — Other Ambulatory Visit: Payer: Self-pay

## 2020-06-07 ENCOUNTER — Ambulatory Visit: Payer: BC Managed Care – PPO | Admitting: Occupational Therapy

## 2020-06-07 DIAGNOSIS — M6281 Muscle weakness (generalized): Secondary | ICD-10-CM

## 2020-06-07 DIAGNOSIS — M25642 Stiffness of left hand, not elsewhere classified: Secondary | ICD-10-CM

## 2020-06-07 DIAGNOSIS — M79642 Pain in left hand: Secondary | ICD-10-CM | POA: Diagnosis not present

## 2020-06-07 DIAGNOSIS — L905 Scar conditions and fibrosis of skin: Secondary | ICD-10-CM

## 2020-06-07 DIAGNOSIS — M25632 Stiffness of left wrist, not elsewhere classified: Secondary | ICD-10-CM
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
## 2020-06-13 ENCOUNTER — Other Ambulatory Visit: Payer: Self-pay

## 2020-06-13 ENCOUNTER — Ambulatory Visit: Payer: BC Managed Care – PPO | Admitting: Occupational Therapy

## 2020-06-13 DIAGNOSIS — L905 Scar conditions and fibrosis of skin: Secondary | ICD-10-CM

## 2020-06-13 DIAGNOSIS — M25642 Stiffness of left hand, not elsewhere classified: Secondary | ICD-10-CM

## 2020-06-13 DIAGNOSIS — M25632 Stiffness of left wrist, not elsewhere classified: Secondary | ICD-10-CM

## 2020-06-13 DIAGNOSIS — M79642 Pain in left hand: Secondary | ICD-10-CM | POA: Diagnosis not present

## 2020-06-13 DIAGNOSIS — M6281 Muscle weakness (generalized): Secondary | ICD-10-CM

## 2020-06-14 ENCOUNTER — Encounter: Payer: Self-pay | Admitting: Occupational Therapy
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
## 2020-06-18 ENCOUNTER — Other Ambulatory Visit: Payer: Self-pay

## 2020-06-18 ENCOUNTER — Ambulatory Visit: Payer: BC Managed Care – PPO | Admitting: Occupational Therapy

## 2020-06-18 DIAGNOSIS — M6281 Muscle weakness (generalized): Secondary | ICD-10-CM

## 2020-06-18 DIAGNOSIS — M25632 Stiffness of left wrist, not elsewhere classified: Secondary | ICD-10-CM

## 2020-06-18 DIAGNOSIS — M79642 Pain in left hand: Secondary | ICD-10-CM

## 2020-06-18 DIAGNOSIS — M25642 Stiffness of left hand, not elsewhere classified: Secondary | ICD-10-CM

## 2020-06-18 DIAGNOSIS — L905 Scar conditions and fibrosis of skin: Secondary | ICD-10-CM
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
## 2020-06-21 ENCOUNTER — Ambulatory Visit: Payer: BC Managed Care – PPO | Admitting: Occupational Therapy

## 2020-06-21 ENCOUNTER — Other Ambulatory Visit: Payer: Self-pay

## 2020-06-21 DIAGNOSIS — M6281 Muscle weakness (generalized): Secondary | ICD-10-CM

## 2020-06-21 DIAGNOSIS — M25642 Stiffness of left hand, not elsewhere classified: Secondary | ICD-10-CM

## 2020-06-21 DIAGNOSIS — M79642 Pain in left hand: Secondary | ICD-10-CM | POA: Diagnosis not present

## 2020-06-21 DIAGNOSIS — L905 Scar conditions and fibrosis of skin: Secondary | ICD-10-CM

## 2020-06-21 DIAGNOSIS — M25632 Stiffness of left wrist, not elsewhere classified: Secondary | ICD-10-CM
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
## 2020-06-25 ENCOUNTER — Ambulatory Visit: Payer: BC Managed Care – PPO | Attending: Orthopedic Surgery | Admitting: Occupational Therapy

## 2020-06-25 ENCOUNTER — Other Ambulatory Visit: Payer: Self-pay

## 2020-06-25 DIAGNOSIS — M25642 Stiffness of left hand, not elsewhere classified: Secondary | ICD-10-CM | POA: Insufficient documentation

## 2020-06-25 DIAGNOSIS — M25632 Stiffness of left wrist, not elsewhere classified: Secondary | ICD-10-CM | POA: Diagnosis present

## 2020-06-25 DIAGNOSIS — M79642 Pain in left hand: Secondary | ICD-10-CM | POA: Diagnosis present

## 2020-06-25 DIAGNOSIS — L905 Scar conditions and fibrosis of skin: Secondary | ICD-10-CM | POA: Diagnosis present

## 2020-06-25 DIAGNOSIS — M6281 Muscle weakness (generalized): Secondary | ICD-10-CM | POA: Diagnosis present
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
## 2020-06-28 ENCOUNTER — Other Ambulatory Visit: Payer: Self-pay

## 2020-06-28 ENCOUNTER — Ambulatory Visit: Payer: BC Managed Care – PPO | Admitting: Occupational Therapy

## 2020-06-28 DIAGNOSIS — M25642 Stiffness of left hand, not elsewhere classified: Secondary | ICD-10-CM

## 2020-06-28 DIAGNOSIS — M6281 Muscle weakness (generalized): Secondary | ICD-10-CM

## 2020-06-28 DIAGNOSIS — M25632 Stiffness of left wrist, not elsewhere classified: Secondary | ICD-10-CM

## 2020-06-28 DIAGNOSIS — M79642 Pain in left hand: Secondary | ICD-10-CM | POA: Diagnosis not present

## 2020-06-28 DIAGNOSIS — L905 Scar conditions and fibrosis of skin: Secondary | ICD-10-CM
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
## 2020-07-01 ENCOUNTER — Other Ambulatory Visit: Payer: Self-pay

## 2020-07-01 ENCOUNTER — Ambulatory Visit: Payer: BC Managed Care – PPO | Admitting: Occupational Therapy

## 2020-07-01 DIAGNOSIS — M25642 Stiffness of left hand, not elsewhere classified: Secondary | ICD-10-CM

## 2020-07-01 DIAGNOSIS — M79642 Pain in left hand: Secondary | ICD-10-CM

## 2020-07-01 DIAGNOSIS — L905 Scar conditions and fibrosis of skin: Secondary | ICD-10-CM

## 2020-07-01 DIAGNOSIS — M25632 Stiffness of left wrist, not elsewhere classified: Secondary | ICD-10-CM

## 2020-07-01 DIAGNOSIS — M6281 Muscle weakness (generalized): Secondary | ICD-10-CM

## 2020-07-01 NOTE — Patient Instructions (Addendum)
R side.
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
## 2020-07-02 ENCOUNTER — Encounter: Payer: BC Managed Care – PPO | Admitting: Occupational Therapy

## 2020-07-03 ENCOUNTER — Other Ambulatory Visit: Payer: Self-pay

## 2020-07-03 ENCOUNTER — Ambulatory Visit: Payer: BC Managed Care – PPO | Admitting: Occupational Therapy

## 2020-07-03 DIAGNOSIS — M25642 Stiffness of left hand, not elsewhere classified: Secondary | ICD-10-CM

## 2020-07-03 DIAGNOSIS — M25632 Stiffness of left wrist, not elsewhere classified: Secondary | ICD-10-CM

## 2020-07-03 DIAGNOSIS — M79642 Pain in left hand: Secondary | ICD-10-CM
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
## 2020-07-08 ENCOUNTER — Ambulatory Visit: Payer: BC Managed Care – PPO | Admitting: Occupational Therapy

## 2020-07-08 ENCOUNTER — Other Ambulatory Visit: Payer: Self-pay

## 2020-07-08 DIAGNOSIS — M25632 Stiffness of left wrist, not elsewhere classified: Secondary | ICD-10-CM

## 2020-07-08 DIAGNOSIS — M79642 Pain in left hand: Secondary | ICD-10-CM | POA: Diagnosis not present

## 2020-07-08 DIAGNOSIS — M25642 Stiffness of left hand, not elsewhere classified: Secondary | ICD-10-CM

## 2020-07-08 DIAGNOSIS — L905 Scar conditions and fibrosis of skin: Secondary | ICD-10-CM

## 2020-07-08 DIAGNOSIS — M6281 Muscle weakness (generalized): Secondary | ICD-10-CM
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
## 2020-07-11 ENCOUNTER — Other Ambulatory Visit: Payer: Self-pay

## 2020-07-11 ENCOUNTER — Ambulatory Visit: Payer: BC Managed Care – PPO | Admitting: Occupational Therapy

## 2020-07-11 DIAGNOSIS — L905 Scar conditions and fibrosis of skin: Secondary | ICD-10-CM

## 2020-07-11 DIAGNOSIS — M25632 Stiffness of left wrist, not elsewhere classified: Secondary | ICD-10-CM

## 2020-07-11 DIAGNOSIS — M25642 Stiffness of left hand, not elsewhere classified: Secondary | ICD-10-CM

## 2020-07-11 DIAGNOSIS — M6281 Muscle weakness (generalized): Secondary | ICD-10-CM

## 2020-07-11 DIAGNOSIS — M79642 Pain in left hand: Secondary | ICD-10-CM

## 2020-07-12 ENCOUNTER — Ambulatory Visit: Payer: BC Managed Care – PPO | Admitting: Occupational Therapy

## 2020-07-16 ENCOUNTER — Other Ambulatory Visit: Payer: Self-pay

## 2020-07-16 ENCOUNTER — Ambulatory Visit: Payer: BC Managed Care – PPO | Admitting: Occupational Therapy

## 2020-07-16 DIAGNOSIS — M25642 Stiffness of left hand, not elsewhere classified: Secondary | ICD-10-CM

## 2020-07-16 DIAGNOSIS — M79642 Pain in left hand: Secondary | ICD-10-CM

## 2020-07-16 DIAGNOSIS — M25632 Stiffness of left wrist, not elsewhere classified: Secondary | ICD-10-CM

## 2020-07-16 DIAGNOSIS — L905 Scar conditions and fibrosis of skin: Secondary | ICD-10-CM

## 2020-07-16 DIAGNOSIS — M6281 Muscle weakness (generalized): Secondary | ICD-10-CM
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
## 2020-07-24 ENCOUNTER — Encounter: Payer: Self-pay | Admitting: Occupational Therapy

## 2020-07-24 ENCOUNTER — Ambulatory Visit: Payer: BC Managed Care – PPO | Admitting: Occupational Therapy

## 2020-07-30 ENCOUNTER — Other Ambulatory Visit: Payer: Self-pay

## 2020-07-30 ENCOUNTER — Ambulatory Visit: Payer: BC Managed Care – PPO | Attending: Orthopedic Surgery | Admitting: Occupational Therapy

## 2020-07-30 DIAGNOSIS — M6281 Muscle weakness (generalized): Secondary | ICD-10-CM | POA: Insufficient documentation

## 2020-07-30 DIAGNOSIS — M25632 Stiffness of left wrist, not elsewhere classified: Secondary | ICD-10-CM | POA: Insufficient documentation

## 2020-07-30 DIAGNOSIS — M79642 Pain in left hand: Secondary | ICD-10-CM | POA: Diagnosis present

## 2020-07-30 DIAGNOSIS — M25642 Stiffness of left hand, not elsewhere classified: Secondary | ICD-10-CM | POA: Diagnosis present

## 2020-07-30 DIAGNOSIS — L905 Scar conditions and fibrosis of skin: Secondary | ICD-10-CM | POA: Insufficient documentation
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
## 2020-08-08 ENCOUNTER — Ambulatory Visit: Payer: BC Managed Care – PPO | Admitting: Occupational Therapy

## 2020-11-29 ENCOUNTER — Encounter: Payer: BC Managed Care – PPO | Admitting: Occupational Therapy

## 2020-12-16 ENCOUNTER — Other Ambulatory Visit: Payer: Self-pay | Admitting: Orthopedic Surgery

## 2020-12-16 DIAGNOSIS — M654 Radial styloid tenosynovitis [de Quervain]: Secondary | ICD-10-CM

## 2020-12-16 DIAGNOSIS — M19132 Post-traumatic osteoarthritis, left wrist: Secondary | ICD-10-CM

## 2020-12-31 ENCOUNTER — Other Ambulatory Visit: Payer: Self-pay

## 2020-12-31 ENCOUNTER — Ambulatory Visit
Admission: RE | Admit: 2020-12-31 | Discharge: 2020-12-31 | Disposition: A | Discharge status: Home / Self Care | Payer: BC Managed Care – PPO | Source: Ambulatory Visit | Attending: Orthopedic Surgery | Admitting: Orthopedic Surgery

## 2020-12-31 DIAGNOSIS — M19132 Post-traumatic osteoarthritis, left wrist: Secondary | ICD-10-CM | POA: Insufficient documentation

## 2020-12-31 DIAGNOSIS — M654 Radial styloid tenosynovitis [de Quervain]: Secondary | ICD-10-CM | POA: Insufficient documentation
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
## 2021-04-01 ENCOUNTER — Other Ambulatory Visit: Payer: Self-pay | Admitting: Orthopedic Surgery

## 2021-04-17 ENCOUNTER — Other Ambulatory Visit: Payer: BC Managed Care – PPO

## 2021-04-22 ENCOUNTER — Encounter
Admission: RE | Admit: 2021-04-22 | Discharge: 2021-04-22 | Disposition: A | Discharge status: Home / Self Care | Payer: BC Managed Care – PPO | Source: Ambulatory Visit | Attending: Orthopedic Surgery | Admitting: Orthopedic Surgery

## 2021-04-22 ENCOUNTER — Other Ambulatory Visit: Payer: Self-pay

## 2021-04-22 NOTE — Patient Instructions (Signed)
Your procedure is scheduled on: 04/24/21 Report to [place]. To find out your arrival time please call [phone] between 1PM - 3PM on 04/23/21.  Remember: Instructions that are not followed completely may result in serious medical risk, up to and including death, or upon the discretion of your surgeon and anesthesiologist your surgery may need to be rescheduled.     _X__ 1. Do not eat food after midnight the night before your procedure.                 No gum chewing or hard candies. You may drink clear liquids up to 2 hours                 before you are scheduled to arrive for your surgery- DO not drink clear                 liquids within 2 hours of the start of your surgery.                 Clear Liquids include:  water, apple juice without pulp, clear carbohydrate                 drink such as Clearfast or Gatorade, Black Coffee or Tea (Do not add                 anything to coffee or tea). Diabetics water only  __X__2.  On the morning of surgery brush your teeth with toothpaste and water, you                 may rinse your mouth with mouthwash if you wish.  Do not swallow any              toothpaste of mouthwash.     _X__ 3.  No Alcohol for 24 hours before or after surgery.   _X__ 4.  Do Not Smoke or use e-cigarettes For 24 Hours Prior to Your Surgery.                 Do not use any chewable tobacco products for at least 6 hours prior to                 surgery.  ____  5.  Bring all medications with you on the day of surgery if instructed.   __X__  6.  Notify your doctor if there is any change in your medical condition      (cold, fever, infections).     Do not wear jewelry, make-up, hairpins, clips or nail polish. Do not wear lotions, powders, or perfumes.  Do not shave body hair 48 hours prior to surgery. Men may shave face and neck. Do not bring valuables to the hospital.    [facility] is not responsible for any  belongings or valuables.  Contacts, dentures/partials or body piercings may not be worn into surgery. Bring a case for your contacts, glasses or hearing aids, a denture cup will be supplied. Leave your suitcase in the car. After surgery it may be brought to your room. For patients admitted to the hospital, discharge time is determined by your treatment team.   Patients discharged the day of surgery will not be allowed to drive home.   Please read over the following fact sheets that you were given:     __X__ Take these medicines the morning of surgery with A SIP OF WATER:  1. none  2.   3.   4.  5.  6.  ____ Fleet Enema (as directed)   ____ Use CHG Soap/SAGE wipes as directed  ____ Use inhalers on the day of surgery  ____ Stop metformin/Janumet/Farxiga 2 days prior to surgery    ____ Take 1/2 of usual insulin dose the night before surgery. No insulin the morning          of surgery.   ____ Stop Blood Thinners Coumadin/Plavix/Xarelto/Pleta/Pradaxa/Eliquis/Effient/Aspirin  on   Or contact your Surgeon, Cardiologist or Medical Doctor regarding  ability to stop your blood thinners  __X__ Stop Anti-inflammatories 7 days before surgery such as Advil, Ibuprofen, Motrin,  BC or Goodies Powder, Naprosyn, Naproxen, Aleve, Aspirin   May take Tylenol if needed  __X__ Stop all herbals and supplements, fish oil or vitamins  until after surgery.    ____ Bring C-Pap to the hospital.

## 2021-04-23 MED ORDER — LACTATED RINGERS IV SOLN: INTRAVENOUS | Status: DC

## 2021-04-23 MED ORDER — FAMOTIDINE 20 MG PO TABS: 20.0000 mg | ORAL_TABLET | Freq: Once | ORAL | Status: DC

## 2021-04-23 MED ORDER — ORAL CARE MOUTH RINSE: 15.0000 mL | Freq: Once | OROMUCOSAL | Status: AC

## 2021-04-23 MED ORDER — CEFAZOLIN SODIUM-DEXTROSE 2-4 GM/100ML-% IV SOLN
2.0000 g | INTRAVENOUS | Status: AC
Start: 2021-04-24 — End: 2021-04-24
  Administered 2021-04-24: 2 g via INTRAVENOUS

## 2021-04-23 MED ORDER — CHLORHEXIDINE GLUCONATE 0.12 % MT SOLN: 15.0000 mL | Freq: Once | OROMUCOSAL | Status: AC

## 2021-04-24 ENCOUNTER — Ambulatory Visit: Payer: BC Managed Care – PPO

## 2021-04-24 ENCOUNTER — Encounter
Admission: RE | Disposition: A | Discharge status: Home / Self Care | Payer: Self-pay | Source: Home / Self Care | Attending: Orthopedic Surgery

## 2021-04-24 ENCOUNTER — Ambulatory Visit: Payer: BC Managed Care – PPO | Admitting: Certified Registered"

## 2021-04-24 ENCOUNTER — Ambulatory Visit
Admission: RE | Admit: 2021-04-24 | Discharge: 2021-04-24 | Disposition: A | Discharge status: Home / Self Care | Payer: BC Managed Care – PPO | Attending: Orthopedic Surgery | Admitting: Orthopedic Surgery

## 2021-04-24 ENCOUNTER — Encounter: Payer: Self-pay | Admitting: Orthopedic Surgery

## 2021-04-24 DIAGNOSIS — M25532 Pain in left wrist: Secondary | ICD-10-CM | POA: Diagnosis not present

## 2021-04-24 DIAGNOSIS — M19132 Post-traumatic osteoarthritis, left wrist: Secondary | ICD-10-CM | POA: Insufficient documentation

## 2021-04-24 DIAGNOSIS — Z981 Arthrodesis status: Secondary | ICD-10-CM

## 2021-04-24 DIAGNOSIS — Z9889 Other specified postprocedural states: Secondary | ICD-10-CM | POA: Insufficient documentation

## 2021-04-24 DIAGNOSIS — Z8781 Personal history of (healed) traumatic fracture: Secondary | ICD-10-CM | POA: Insufficient documentation

## 2021-04-24 DIAGNOSIS — F17200 Nicotine dependence, unspecified, uncomplicated: Secondary | ICD-10-CM | POA: Diagnosis not present

## 2021-04-24 HISTORY — PX: WRIST FUSION: SHX839

## 2021-04-24 IMAGING — DX DG WRIST 2V*L*
2 series · 2 of 2 positions shown · non-contrast
Comparison: Left wrist radiographs [DATE]

CLINICAL DATA: Status post left wrist surgery.

EXAM:
LEFT WRIST - 2 VIEW

[wrist ap]
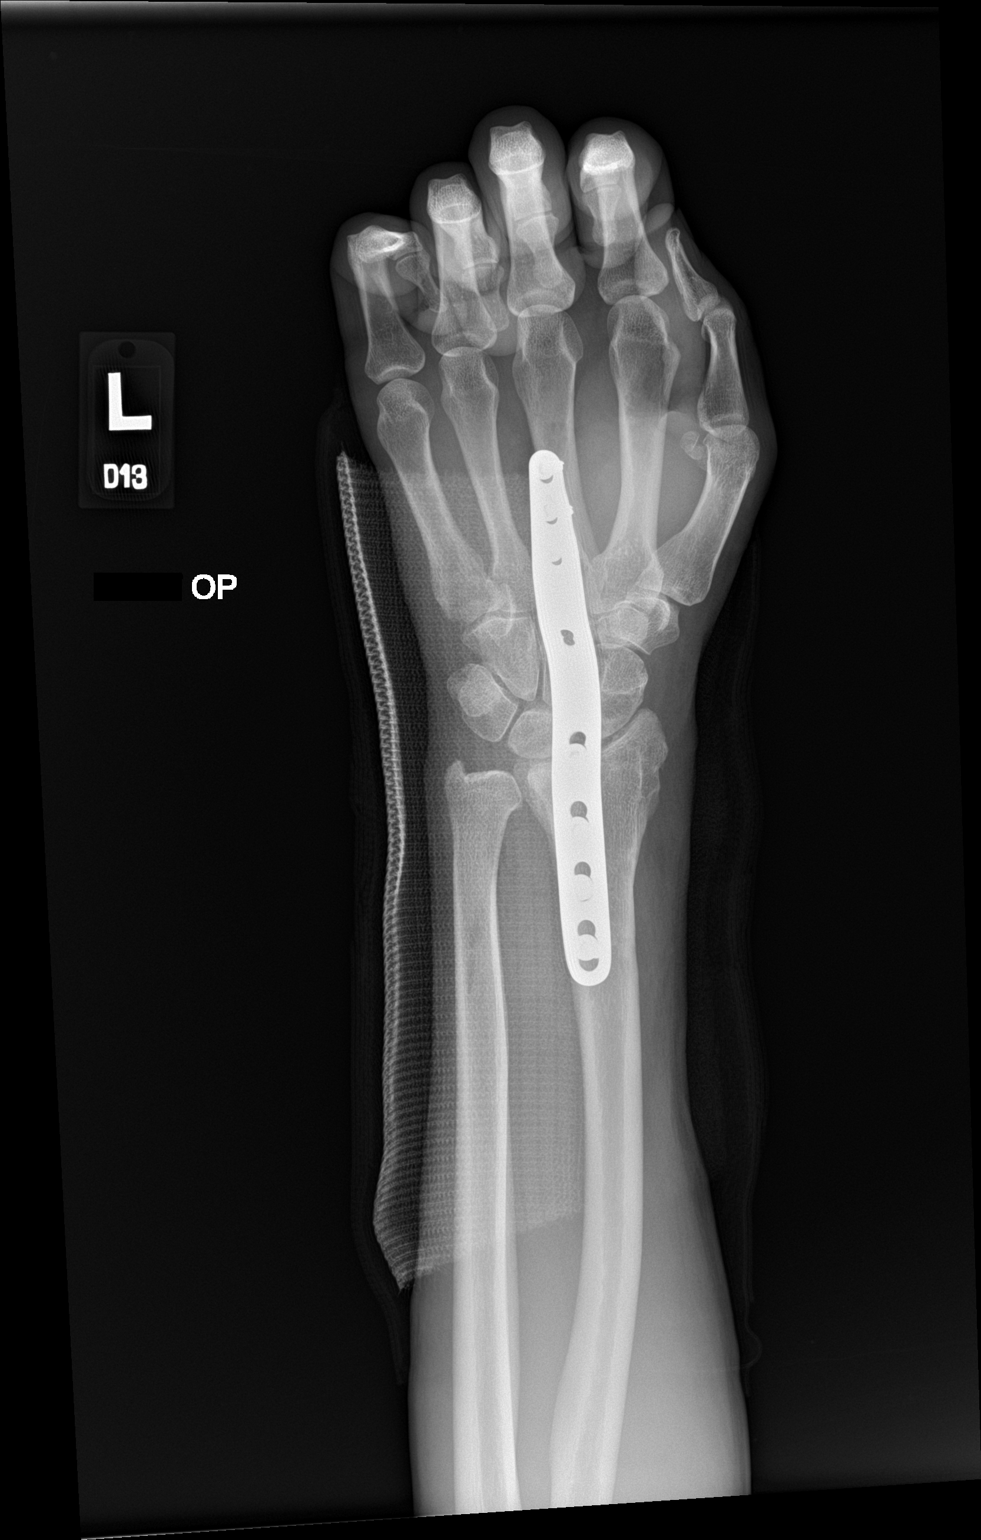

[wrist lat]
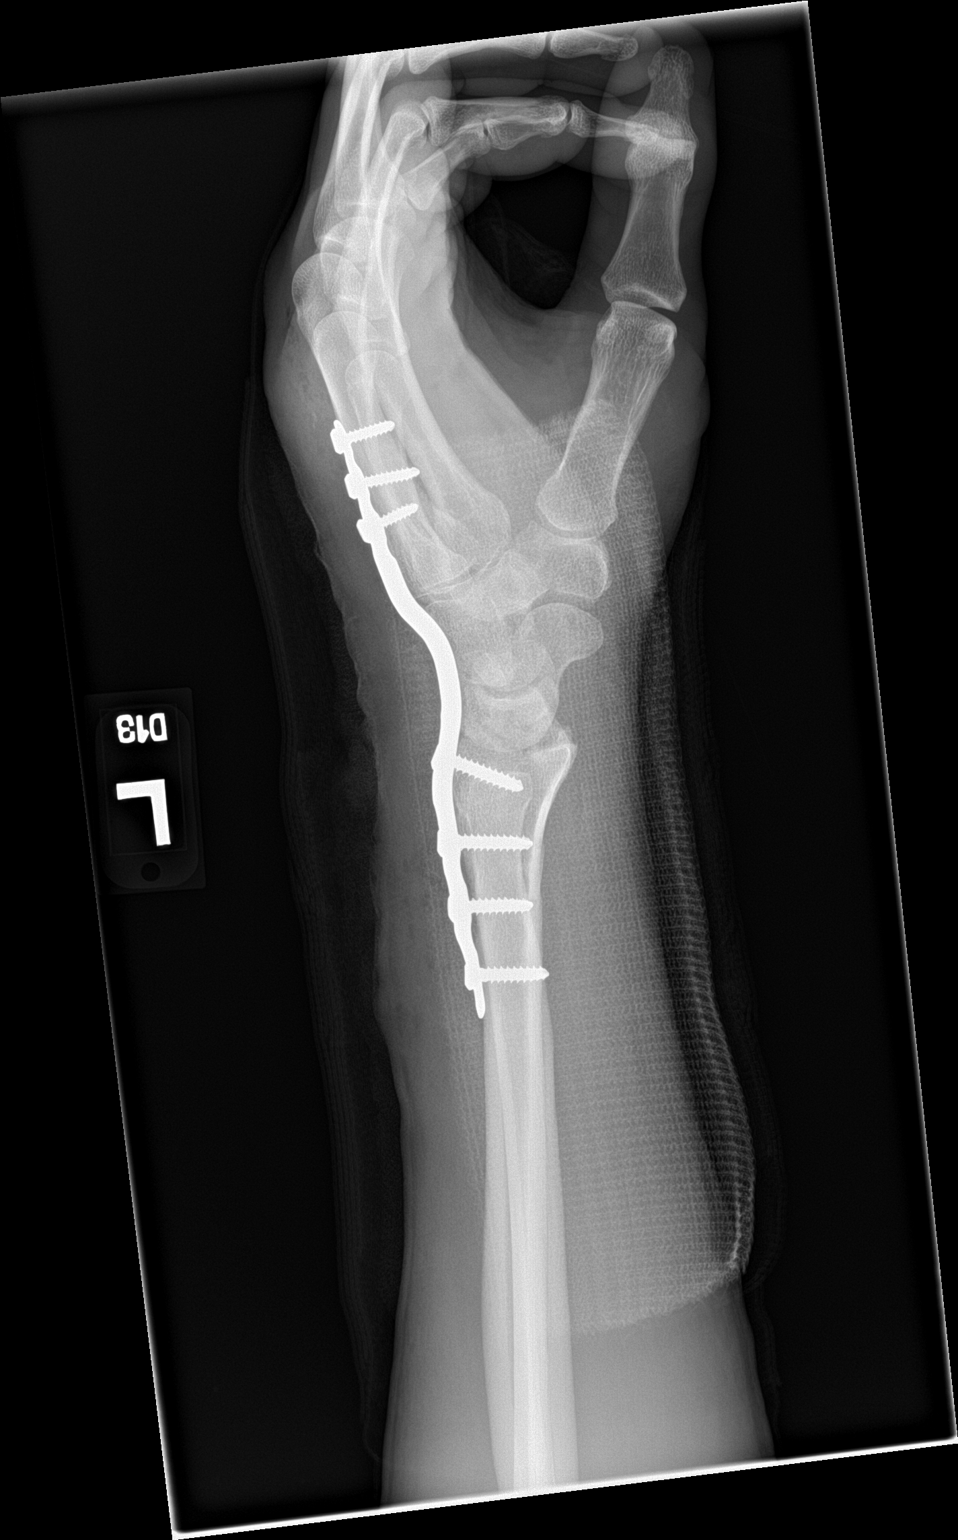

[2 of 2 positions shown; findings below may reference images not displayed]

FINDINGS: Interval removal of prior dorsal radial and volar plate and screws.
New single long dorsal distal radial plate and screw fixation
extending along the metacarpals and with 3 distal fixation screws
within the third metacarpal shaft. Mild radiocarpal joint space
narrowing. No acute fracture or dislocation.
IMPRESSION: Interval removal of prior hardware and placement of new
radial-carpal-third metacarpal dorsal fixation plate and screws.

## 2021-04-24 SURGERY — FUSION, JOINT, WRIST
Anesthesia: General | Site: Wrist | Laterality: Left

## 2021-04-24 MED ORDER — HYDROMORPHONE HCL 1 MG/ML IJ SOLN
INTRAMUSCULAR | Status: DC | PRN
Start: 2021-04-24 — End: 2021-04-24
  Administered 2021-04-24: .2 mg via INTRAVENOUS

## 2021-04-24 MED ORDER — HYDROCODONE-ACETAMINOPHEN 7.5-325 MG PO TABS: 1.0000 | ORAL_TABLET | Freq: Four times a day (QID) | ORAL | 0 refills | Status: DC | PRN

## 2021-04-24 MED ORDER — ACETAMINOPHEN 10 MG/ML IV SOLN
INTRAVENOUS | Status: DC | PRN
Start: 2021-04-24 — End: 2021-04-24
  Administered 2021-04-24: 1000 mg via INTRAVENOUS

## 2021-04-24 MED ORDER — FENTANYL CITRATE (PF) 100 MCG/2ML IJ SOLN
INTRAMUSCULAR | Status: DC | PRN
  Administered 2021-04-24: 100 ug via INTRAVENOUS

## 2021-04-24 MED ORDER — CEFAZOLIN SODIUM-DEXTROSE 2-4 GM/100ML-% IV SOLN
INTRAVENOUS | Status: AC
  Filled 2021-04-24: qty 100

## 2021-04-24 MED ORDER — BUPIVACAINE HCL (PF) 0.5 % IJ SOLN
INTRAMUSCULAR | Status: AC
  Filled 2021-04-24: qty 30

## 2021-04-24 MED ORDER — MIDAZOLAM HCL 2 MG/2ML IJ SOLN
INTRAMUSCULAR | Status: AC
  Filled 2021-04-24: qty 2

## 2021-04-24 MED ORDER — DEXAMETHASONE SODIUM PHOSPHATE 10 MG/ML IJ SOLN
INTRAMUSCULAR | Status: DC | PRN
  Administered 2021-04-24: 10 mg via INTRAVENOUS

## 2021-04-24 MED ORDER — METOCLOPRAMIDE HCL 10 MG PO TABS: 5.0000 mg | ORAL_TABLET | Freq: Three times a day (TID) | ORAL | Status: DC | PRN

## 2021-04-24 MED ORDER — OXYCODONE HCL 5 MG PO TABS
5.0000 mg | ORAL_TABLET | Freq: Once | ORAL | Status: AC | PRN
  Administered 2021-04-24: 5 mg via ORAL

## 2021-04-24 MED ORDER — LIDOCAINE HCL (CARDIAC) PF 100 MG/5ML IV SOSY
PREFILLED_SYRINGE | INTRAVENOUS | Status: DC | PRN
  Administered 2021-04-24: 100 mg via INTRAVENOUS

## 2021-04-24 MED ORDER — ROCURONIUM BROMIDE 10 MG/ML (PF) SYRINGE
PREFILLED_SYRINGE | INTRAVENOUS | Status: AC
  Filled 2021-04-24: qty 10

## 2021-04-24 MED ORDER — KETOROLAC TROMETHAMINE 30 MG/ML IJ SOLN
INTRAMUSCULAR | Status: DC | PRN
Start: 2021-04-24 — End: 2021-04-24
  Administered 2021-04-24: 30 mg via INTRAVENOUS

## 2021-04-24 MED ORDER — MIDAZOLAM HCL 2 MG/2ML IJ SOLN
INTRAMUSCULAR | Status: DC | PRN
Start: 2021-04-24 — End: 2021-04-24
  Administered 2021-04-24: 2 mg via INTRAVENOUS

## 2021-04-24 MED ORDER — DEXMEDETOMIDINE (PRECEDEX) IN NS 20 MCG/5ML (4 MCG/ML) IV SYRINGE
PREFILLED_SYRINGE | INTRAVENOUS | Status: DC | PRN
  Administered 2021-04-24 (×3): 10 ug via INTRAVENOUS

## 2021-04-24 MED ORDER — ONDANSETRON HCL 4 MG/2ML IJ SOLN
INTRAMUSCULAR | Status: AC
  Filled 2021-04-24: qty 2

## 2021-04-24 MED ORDER — DEXMEDETOMIDINE (PRECEDEX) IN NS 20 MCG/5ML (4 MCG/ML) IV SYRINGE
PREFILLED_SYRINGE | INTRAVENOUS | Status: AC
  Filled 2021-04-24: qty 5

## 2021-04-24 MED ORDER — FENTANYL CITRATE (PF) 100 MCG/2ML IJ SOLN
INTRAMUSCULAR | Status: AC
  Filled 2021-04-24: qty 2

## 2021-04-24 MED ORDER — HYDROMORPHONE HCL 1 MG/ML IJ SOLN
INTRAMUSCULAR | Status: AC
  Administered 2021-04-24: 0.5 mg via INTRAVENOUS
  Filled 2021-04-24: qty 1

## 2021-04-24 MED ORDER — SODIUM CHLORIDE 0.9 % IR SOLN
Status: DC | PRN
  Administered 2021-04-24: 502 mL

## 2021-04-24 MED ORDER — METOCLOPRAMIDE HCL 5 MG/ML IJ SOLN: 5.0000 mg | Freq: Three times a day (TID) | INTRAMUSCULAR | Status: DC | PRN

## 2021-04-24 MED ORDER — OXYCODONE HCL 5 MG PO TABS
ORAL_TABLET | ORAL | Status: AC
  Filled 2021-04-24: qty 1

## 2021-04-24 MED ORDER — ROCURONIUM BROMIDE 100 MG/10ML IV SOLN
INTRAVENOUS | Status: DC | PRN
  Administered 2021-04-24: 20 mg via INTRAVENOUS
  Administered 2021-04-24: 50 mg via INTRAVENOUS

## 2021-04-24 MED ORDER — FENTANYL CITRATE (PF) 100 MCG/2ML IJ SOLN
25.0000 ug | INTRAMUSCULAR | Status: DC | PRN
  Administered 2021-04-24: 50 ug via INTRAVENOUS

## 2021-04-24 MED ORDER — LIDOCAINE HCL (PF) 2 % IJ SOLN
INTRAMUSCULAR | Status: AC
  Filled 2021-04-24: qty 5

## 2021-04-24 MED ORDER — SUGAMMADEX SODIUM 200 MG/2ML IV SOLN
INTRAVENOUS | Status: DC | PRN
  Administered 2021-04-24: 400 mg via INTRAVENOUS

## 2021-04-24 MED ORDER — ONDANSETRON HCL 4 MG/2ML IJ SOLN: 4.0000 mg | Freq: Four times a day (QID) | INTRAMUSCULAR | Status: DC | PRN

## 2021-04-24 MED ORDER — SUGAMMADEX SODIUM 500 MG/5ML IV SOLN
INTRAVENOUS | Status: AC
  Filled 2021-04-24: qty 5

## 2021-04-24 MED ORDER — PROPOFOL 10 MG/ML IV BOLUS
INTRAVENOUS | Status: DC | PRN
  Administered 2021-04-24: 150 mg via INTRAVENOUS

## 2021-04-24 MED ORDER — ACETAMINOPHEN 10 MG/ML IV SOLN
INTRAVENOUS | Status: AC
  Filled 2021-04-24: qty 100

## 2021-04-24 MED ORDER — ONDANSETRON HCL 4 MG PO TABS: 4.0000 mg | ORAL_TABLET | Freq: Four times a day (QID) | ORAL | Status: DC | PRN

## 2021-04-24 MED ORDER — BUPIVACAINE HCL 0.5 % IJ SOLN
INTRAMUSCULAR | Status: DC | PRN
  Administered 2021-04-24: 10 mL

## 2021-04-24 MED ORDER — OXYCODONE HCL 5 MG/5ML PO SOLN: 5.0000 mg | Freq: Once | ORAL | Status: AC | PRN

## 2021-04-24 MED ORDER — NEOMYCIN-POLYMYXIN B GU 40-200000 IR SOLN
Status: AC
  Filled 2021-04-24: qty 2

## 2021-04-24 MED ORDER — DEXAMETHASONE SODIUM PHOSPHATE 10 MG/ML IJ SOLN
INTRAMUSCULAR | Status: AC
  Filled 2021-04-24: qty 1

## 2021-04-24 MED ORDER — CHLORHEXIDINE GLUCONATE 0.12 % MT SOLN
OROMUCOSAL | Status: AC
Start: 2021-04-24 — End: 2021-04-24
  Filled 2021-04-24: qty 15

## 2021-04-24 MED ORDER — HYDROMORPHONE HCL 1 MG/ML IJ SOLN
INTRAMUSCULAR | Status: AC
  Filled 2021-04-24: qty 1

## 2021-04-24 MED ORDER — SODIUM CHLORIDE 0.9 % IV SOLN: INTRAVENOUS | Status: DC

## 2021-04-24 MED ORDER — ONDANSETRON HCL 4 MG/2ML IJ SOLN
INTRAMUSCULAR | Status: DC | PRN
Start: 2021-04-24 — End: 2021-04-24
  Administered 2021-04-24: 4 mg via INTRAVENOUS

## 2021-04-24 MED ORDER — HYDROMORPHONE HCL 1 MG/ML IJ SOLN
0.5000 mg | INTRAMUSCULAR | Status: AC | PRN
  Administered 2021-04-24 (×2): 0.5 mg via INTRAVENOUS

## 2021-04-24 MED ORDER — FENTANYL CITRATE (PF) 100 MCG/2ML IJ SOLN
INTRAMUSCULAR | Status: AC
  Administered 2021-04-24: 50 ug via INTRAVENOUS
  Filled 2021-04-24: qty 2

## 2021-04-24 MED ORDER — FAMOTIDINE 20 MG PO TABS
ORAL_TABLET | ORAL | Status: AC
  Filled 2021-04-24: qty 1

## 2021-04-24 SURGICAL SUPPLY — 45 items
APL PRP STRL LF DISP 70% ISPRP (MISCELLANEOUS) ×1
BIT DRILL 2.5X110 QC LCP DISP (BIT) ×1 IMPLANT
BIT DRILL LCP QC 2X140 (BIT) ×1 IMPLANT
BNDG CMPR STD VLCR NS LF 5.8X4 (GAUZE/BANDAGES/DRESSINGS) ×1
BNDG ELASTIC 4X5.8 VLCR NS LF (GAUZE/BANDAGES/DRESSINGS) ×2 IMPLANT
BNDG ELASTIC 4X5.8 VLCR STR LF (GAUZE/BANDAGES/DRESSINGS) ×2 IMPLANT
BUR 4X55 1 (BURR) ×1 IMPLANT
CHLORAPREP W/TINT 26 (MISCELLANEOUS) ×2 IMPLANT
CUFF TOURN SGL QUICK 18X4 (TOURNIQUET CUFF) IMPLANT
DRAPE FLUOR MINI C-ARM 54X84 (DRAPES) ×2 IMPLANT
ELECT CAUTERY BLADE 6.4 (BLADE) ×2 IMPLANT
ELECT REM PT RETURN 9FT ADLT (ELECTROSURGICAL) ×2
ELECTRODE REM PT RTRN 9FT ADLT (ELECTROSURGICAL) ×1 IMPLANT
GAUZE SPONGE 4X4 12PLY STRL (GAUZE/BANDAGES/DRESSINGS) ×2 IMPLANT
GAUZE XEROFORM 1X8 LF (GAUZE/BANDAGES/DRESSINGS) ×2 IMPLANT
GLOVE SURG SYN 9.0  PF PI (GLOVE) ×1
GLOVE SURG SYN 9.0 PF PI (GLOVE) ×1 IMPLANT
GOWN SRG 2XL LVL 4 RGLN SLV (GOWNS) ×1 IMPLANT
GOWN STRL NON-REIN 2XL LVL4 (GOWNS) ×2
GOWN STRL REUS W/ TWL LRG LVL3 (GOWN DISPOSABLE) ×1 IMPLANT
GOWN STRL REUS W/TWL LRG LVL3 (GOWN DISPOSABLE) ×2
KIT TURNOVER KIT A (KITS) ×2 IMPLANT
MANIFOLD NEPTUNE II (INSTRUMENTS) ×2 IMPLANT
NDL FILTER BLUNT 18X1 1/2 (NEEDLE) ×1 IMPLANT
NEEDLE FILTER BLUNT 18X 1/2SAF (NEEDLE) ×1
NEEDLE FILTER BLUNT 18X1 1/2 (NEEDLE) ×1 IMPLANT
NS IRRIG 500ML POUR BTL (IV SOLUTION) ×2 IMPLANT
PACK EXTREMITY ARMC (MISCELLANEOUS) ×2 IMPLANT
PAD CAST CTTN 4X4 STRL (SOFTGOODS) ×1 IMPLANT
PADDING CAST COTTON 4X4 STRL (SOFTGOODS) ×2
PLATE WRIST FUSION SHRT BEND (Plate) ×1 IMPLANT
PUTTY DBX 2.5CC (Putty) ×2 IMPLANT
PUTTY DBX 2.5CC DEPUY (Putty) IMPLANT
SCALPEL PROTECTED #15 DISP (BLADE) ×4 IMPLANT
SCREW CORT ST 3.5X16 (Screw) ×2 IMPLANT
SCREW CORT ST 3.5X18 (Screw) ×2 IMPLANT
SCREW SELF TAP 12M (Screw) ×2 IMPLANT
SCREW SELF TAP 14MM (Screw) ×2 IMPLANT
SPLINT CAST 1 STEP 3X12 (MISCELLANEOUS) ×2 IMPLANT
SUT ETHILON 4-0 (SUTURE) ×2
SUT ETHILON 4-0 FS2 18XMFL BLK (SUTURE) ×1
SUT VICRYL 3-0 27IN (SUTURE) ×2 IMPLANT
SUTURE ETHLN 4-0 FS2 18XMF BLK (SUTURE) ×1 IMPLANT
SYR 3ML LL SCALE MARK (SYRINGE) ×2 IMPLANT
WATER STERILE IRR 500ML POUR (IV SOLUTION) ×2 IMPLANT

## 2021-04-24 NOTE — Transfer of Care (Signed)
Immediate Anesthesia Transfer of Care Note ? ?Patient: James Boyer ? ?Procedure(s) Performed: ARTHRODESIS WRIST Left wrist fusion (Left: Wrist) ? ?Patient Location: PACU ? ?Anesthesia Type:General ? ?Level of Consciousness: drowsy ? ?Airway & Oxygen Therapy: Patient Spontanous Breathing and Patient connected to nasal cannula oxygen ? ?Post-op Assessment: Report given to RN and Post -op Vital signs reviewed and stable ? ?Post vital signs: stable ? ?Last Vitals:  ?Vitals Value Taken Time  ?BP 123/92 04/24/21 1203  ?Temp 36.2 ?C 04/24/21 1203  ?Pulse 85 04/24/21 1207  ?Resp 12 04/24/21 1207  ?SpO2 100 % 04/24/21 1207  ?Vitals shown include unvalidated device data. ? ?Last Pain:  ?Vitals:  ? 04/24/21 0920  ?TempSrc: Oral  ?PainSc: 3   ?   ? ?  ? ?Complications: No notable events documented. ?

## 2021-04-24 NOTE — H&P (Signed)
Chief Complaint  ?Patient presents with  ? Pre-op Exam  ?Scheduled for left wrist fusion  ? ?James Boyer is a 41 y.o. male who presents today for history and physical for left wrist fusion with Dr. Kennedy Bucker on 04/24/2021. The patient has a history of posttraumatic left wrist osteoarthritic changes, the patient does have a history of a significant left distal radius fracture, subsequent open reduction internal fixation and hardware removal. The patient has been followed by Dr. Rosita Kea and has had continued severe pain and limited motion of the left wrist. Patient most recently has been a trial in a cast for 2 weeks, saw significant improvement of wrist pain. He denies any repeat trauma or injury affecting the left wrist. The patient does report a 5 out of 10 pain score in the left wrist. The patient denies any personal history of heart attack, stroke, asthma or COPD. Patient has agreed and consented to a left wrist fusion with Dr. Rosita Kea on 04/24/2021 ? ?Past Medical History: ?Past Medical History:  ?Diagnosis Date  ? Allergy  ? Anxiety  ? Arthritis  ? Psoriasis  ?as a child  ? ?Past Surgical History: ?Past Surgical History:  ?Procedure Laterality Date  ? ORIF left wrist Left 01/11/2020  ?Dr Rosita Kea  ? C6-7 ANTERIOR CERVICAL DISCECTOMY AND FUSION 01/19/2020  ?Dr. Venetia Night @ [facility], [place]  ? APPENDECTOMY  ? CYSTECTOMY  ? FRACTURE SURGERY Left  ?COLLAR BONE  ? ?Past Family History: ?Family History  ?Problem Relation Age of Onset  ? High blood pressure (Hypertension) Mother  ? Kidney disease Mother  ? Lung cancer Father  ? Dementia Maternal Grandmother  ? Dementia Paternal Grandmother  ? ?Medications: ?Current Outpatient Medications Ordered in Epic  ?Medication Sig Dispense Refill  ? cetirizine (ZYRTEC) 10 MG tablet Take 10 mg by mouth once daily  ? glucosamine sulfate (GLUCOSAMINE ORAL) Take 1 tablet by mouth once daily  ? ibuprofen (MOTRIN) 200 MG tablet Take 200 mg by mouth every 6 (six) hours as needed for Pain  ?  multivit-min/folic/vit K/lycop (MEN'S MULTIVITAMIN ORAL) Take 1 tablet by mouth once daily  ? ?No current Epic-ordered facility-administered medications on file.  ? ?Allergies: ?No Known Allergies  ? ?Review of Systems:  ?A comprehensive 14 point ROS was performed, reviewed by me today, and the pertinent orthopaedic findings are documented in the HPI. ? ?Exam: ?BP (!) 150/92  Ht 172.7 cm (5\' 8" )  Wt 65.7 kg (144 lb 12.8 oz)  BMI 22.02 kg/m?  ?General/Constitutional: The patient appears to be well-nourished, well-developed, and in no acute distress. ?Neuro/Psych: Normal mood and affect, oriented to person, place and time. ?Eyes: Non-icteric. Pupils are equal, round, and reactive to light, and exhibit synchronous movement. ?ENT: Unremarkable. ?Lymphatic: No palpable adenopathy. ?Respiratory: Lungs clear to auscultation, Normal chest excursion, No wheezes and Non-labored breathing ?Cardiovascular: Regular rate and rhythm. No murmurs. and No edema, swelling or tenderness, except as noted in detailed exam. ?Integumentary: No impressive skin lesions present, except as noted in detailed exam. ?Musculoskeletal: Unremarkable, except as noted in detailed exam. ? ?Examination of the left wrist shows 15 to 20 degrees of wrist flexion and extension. Good pronation supination. No significant swelling. No tenderness along the incision sites. Slightly tender along the dorsal radiocarpal joint. No ulnar styloid or TFCC tenderness. No catching clicking or popping with pronation or supination. Tender along the radial styloid with positive Finkelstein's test. No noticeable soft tissue swelling. ? ?Imaging: ?Left wrist x-rays reviewed by me from 11/25/2020 show degenerative  changes of the left radiocarpal joint with offset of the distal radius. Mild subchondral cyst formation noted. No evidence of acute bony abnormality. All fractures appear to be healed. ? 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 ?Plan:  ?1. 41 year old male with complicated left wrist fracture with distal radius ORIF followed by hardware removal has had persistent pain for greater than 1 year despite conservative treatment with injections, bracing, physical therapy. Risks, benefits, complications of a left wrist fusion have been discussed with the patient. Patient has agreed and consent the procedure with Dr. Kennedy Bucker on 04/24/2021 ? ?The procedure was discussed with the patient, as were the potential risks (including bleeding, infection, nerve and/or blood vessel injury, persistent or recurrent pain, future hardware removal, need for further surgery, blood clots, strokes, heart attacks and/or arhythmias, pneumonia, etc.) and benefits. The patient states his understanding and wishes to proceed. ? ?Review of the [place] CSRS was performed in accordance of the [facility] prior to dispensing any controlled drugs. ? ?This note was generated in part with voice recognition software and I apologize for any typographical errors that were not detected and corrected. ? ?Cranston Neighbor, PA-C ?[facility] ?Electronically signed by Patience Musca, PA at 04/21/2021 8:54 AM EST ? ?Reviewed  H+P. ?No changes noted. ? ? ?

## 2021-04-24 NOTE — Anesthesia Postprocedure Evaluation (Signed)
Anesthesia Post Note ? ?Patient: James Boyer ? ?Procedure(s) Performed: ARTHRODESIS WRIST Left wrist fusion (Left: Wrist) ? ?Patient location during evaluation: PACU ?Anesthesia Type: General ?Level of consciousness: awake and alert ?Pain management: pain level controlled ?Vital Signs Assessment: post-procedure vital signs reviewed and stable ?Respiratory status: spontaneous breathing, nonlabored ventilation, respiratory function stable and patient connected to nasal cannula oxygen ?Cardiovascular status: blood pressure returned to baseline and stable ?Postop Assessment: no apparent nausea or vomiting ?Anesthetic complications: no ? ? ?No notable events documented. ? ? ?Last Vitals:  ?Vitals:  ? 04/24/21 1330 04/24/21 1355  ?BP: 118/81 122/89  ?Pulse: 78 84  ?Resp: 14 14  ?Temp:  36.6 ?C  ?SpO2: 97% 97%  ?  ?Last Pain:  ?Vitals:  ? 04/24/21 1355  ?TempSrc: Oral  ?PainSc: 5   ? ? ?  ?  ?  ?  ?  ?  ? ?Cleda Mccreedy Jonuel Butterfield ? ? ? ? ?
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
## 2021-04-24 NOTE — Discharge Instructions (Addendum)
Keep arm elevated is much as possible. ?Work on finger range of motion. ?Ice to the back of the wrist may help with pain and swelling ?Some numbing medicine was injected to the may have some numbness to the back your hand today ?Call office if you are having problems ?Pain medicine as directed ? ? ? ?AMBULATORY SURGERY  ?DISCHARGE INSTRUCTIONS ? ? ?The drugs that you were given will stay in your system until tomorrow so for the next 24 hours you should not: ? ?Drive an automobile ?Make any legal decisions ?Drink any alcoholic beverage ? ? ?You may resume regular meals tomorrow.  Today it is better to start with liquids and gradually work up to solid foods. ? ?You may eat anything you prefer, but it is better to start with liquids, then soup and crackers, and gradually work up to solid foods. ? ? ?Please notify your doctor immediately if you have any unusual bleeding, trouble breathing, redness and pain at the surgery site, drainage, fever, or pain not relieved by medication. ? ? ? ?Additional Instructions: ? ? ? ? ? ? ? ?Please contact your physician with any problems or Same Day Surgery at [phone], Monday through Friday 6 am to 4 pm, or [place] at [facility] number at [phone].  ?

## 2021-04-24 NOTE — Anesthesia Procedure Notes (Signed)
Procedure Name: Intubation ?Date/Time: 04/24/2021 10:18 AM ?Performed by: Rodney Booze, CRNA ?Pre-anesthesia Checklist: Patient identified, Emergency Drugs available, Suction available and Patient being monitored ?Patient Re-evaluated:Patient Re-evaluated prior to induction ?Oxygen Delivery Method: Circle system utilized ?Preoxygenation: Pre-oxygenation with 100% oxygen ?Induction Type: IV induction ?Ventilation: Mask ventilation without difficulty ?Laryngoscope Size: Hyacinth Meeker and 2 ?Tube type: Oral ?Number of attempts: 1 ?Airway Equipment and Method: Stylet and Oral airway ?Placement Confirmation: ETT inserted through vocal cords under direct vision, positive ETCO2 and breath sounds checked- equal and bilateral ?Secured at: 20 cm ?Tube secured with: Tape ?Dental Injury: Teeth and Oropharynx as per pre-operative assessment  ? ? ? ? ?

## 2021-04-24 NOTE — Op Note (Signed)
04/24/2021 ? ?12:05 PM ? ?PATIENT:  James Boyer  41 y.o. male ? 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 ?PROCEDURE:  Procedure(s): ?ARTHRODESIS WRIST Left wrist fusion (Left) ? ?SURGEON: Leitha Schuller, MD ? ?ASSISTANTS: None ? ?ANESTHESIA:   general ? ?EBL:  Total I/O ?In: 1100 [I.V.:900; IV Piggyback:200] ?Out: 25 [Blood:25] ? ?BLOOD ADMINISTERED:none ? ?DRAINS: none  ? ?LOCAL MEDICATIONS USED:  MARCAINE    ? ?SPECIMEN:  No Specimen ? ?DISPOSITION OF SPECIMEN:  N/A ? ?COUNTS:  YES ? ?TOURNIQUET:   ?Total Tourniquet Time Documented: ?Upper Arm (Left) - 70 minutes ?Total: Upper Arm (Left) - 70 minutes ? ? ?IMPLANTS: Synthes wrist usion plate with multiple screws ? ?DICTATION: .Dragon Dictation patient was brought to the operating room and the left arm was prepped and draped in usual sterile fashion.  After patient identification and timeout procedures were completed and exsanguination of the limb with an Esmarch the tourniquet was raised.  The proximal portion of the incision was a previous incision and it was extended to the mid third metacarpal.  Subcutaneous tissue was spread and incised and the tendons were retracted medially and laterally for exposure of the dorsal radius and carpus.  Penrose drains were used to protect these during the case and keep them out of the way.  The dorsal capsule was incised and lifted as a flap with the periosteum and with traction applied a bur was used to denude the bone of the distal radius as well as scaphoid lunate and as well as between the scapholunate and the capitate.  An osteotome was used to resect dorsal bone from the distal radius this was used subsequently as bone graft and a curette was used to harvest bone from the styloid for  subsequent fusion.  This point the wrist was thoroughly irrigated and the plate determined and applied with proximal and distal screws inserted making certain that it was in the midline of the radius and third metacarpal.  At this point the bone graft was impacted and around the fusion sites at the carpus and radiocarpal joint with the third MCP joint not fused.  Compression was applied through the plate proximally after 3 distal cortical screws been placed to get good compression at the wrist joint.  All screw holes were filled using standard technique drilling measuring and placing the cortical screws with 1 screw changed out as it was somewhat prominent in the metacarpal.  Permanent mini C arm views were obtained during the procedure to assess alignment and hardware position.  At the close of the case 10 cc of half percent Marcaine were injected around the incision for postop analgesia and DBX bone putty was used to fill in any small defects.  The wound was closed with 3-0 Vicryl subcutaneously and 4-0 nylon for the skin followed by Xeroform 4 x 4's web roll volar splint and Ace with tourniquet let down prior to closure. ? ?PLAN OF CARE: Discharge to home after PACU ? ?PATIENT DISPOSITION:  PACU - hemodynamically stable. ?  ? ?

## 2021-04-25 ENCOUNTER — Encounter: Payer: Self-pay | Admitting: Orthopedic Surgery

## 2021-12-03 ENCOUNTER — Other Ambulatory Visit: Payer: Self-pay | Admitting: Orthopedic Surgery

## 2021-12-24 ENCOUNTER — Other Ambulatory Visit: Payer: Self-pay

## 2021-12-24 ENCOUNTER — Encounter: Payer: Self-pay | Admitting: Orthopedic Surgery

## 2021-12-30 ENCOUNTER — Ambulatory Visit: Payer: BC Managed Care – PPO | Admitting: Anesthesiology

## 2021-12-30 ENCOUNTER — Encounter: Payer: Self-pay | Admitting: Orthopedic Surgery

## 2021-12-30 ENCOUNTER — Other Ambulatory Visit: Payer: Self-pay

## 2021-12-30 ENCOUNTER — Ambulatory Visit
Admission: RE | Admit: 2021-12-30 | Discharge: 2021-12-30 | Disposition: A | Discharge status: Home / Self Care | Payer: BC Managed Care – PPO | Attending: Orthopedic Surgery | Admitting: Orthopedic Surgery

## 2021-12-30 ENCOUNTER — Encounter
Admission: RE | Disposition: A | Discharge status: Home / Self Care | Payer: Self-pay | Source: Home / Self Care | Attending: Orthopedic Surgery

## 2021-12-30 ENCOUNTER — Ambulatory Visit: Payer: Self-pay

## 2021-12-30 DIAGNOSIS — T1490XS Injury, unspecified, sequela: Secondary | ICD-10-CM | POA: Insufficient documentation

## 2021-12-30 DIAGNOSIS — M19132 Post-traumatic osteoarthritis, left wrist: Secondary | ICD-10-CM | POA: Diagnosis not present

## 2021-12-30 DIAGNOSIS — M960 Pseudarthrosis after fusion or arthrodesis: Secondary | ICD-10-CM | POA: Insufficient documentation

## 2021-12-30 DIAGNOSIS — M25532 Pain in left wrist: Secondary | ICD-10-CM | POA: Insufficient documentation

## 2021-12-30 DIAGNOSIS — Z9889 Other specified postprocedural states: Secondary | ICD-10-CM | POA: Diagnosis not present

## 2021-12-30 DIAGNOSIS — F17210 Nicotine dependence, cigarettes, uncomplicated: Secondary | ICD-10-CM | POA: Diagnosis not present

## 2021-12-30 DIAGNOSIS — Y834 Other reconstructive surgery as the cause of abnormal reaction of the patient, or of later complication, without mention of misadventure at the time of the procedure: Secondary | ICD-10-CM | POA: Diagnosis not present

## 2021-12-30 HISTORY — PX: WRIST FUSION WITH ILIAC CREST BONE GRAFT: SHX5682

## 2021-12-30 SURGERY — WRIST FUSION WITH ILIAC CREST BONE GRAFT
Anesthesia: General | Site: Wrist | Laterality: Left

## 2021-12-30 MED ORDER — ONDANSETRON HCL 4 MG/2ML IJ SOLN
INTRAMUSCULAR | Status: DC | PRN
  Administered 2021-12-30: 4 mg via INTRAVENOUS

## 2021-12-30 MED ORDER — ACETAMINOPHEN 325 MG PO TABS: 325.0000 mg | ORAL_TABLET | Freq: Four times a day (QID) | ORAL | Status: DC | PRN

## 2021-12-30 MED ORDER — 0.9 % SODIUM CHLORIDE (POUR BTL) OPTIME
TOPICAL | Status: DC | PRN
  Administered 2021-12-30: 500 mL

## 2021-12-30 MED ORDER — ONDANSETRON HCL 4 MG/2ML IJ SOLN: 4.0000 mg | Freq: Four times a day (QID) | INTRAMUSCULAR | Status: DC | PRN

## 2021-12-30 MED ORDER — METOCLOPRAMIDE HCL 5 MG PO TABS: 5.0000 mg | ORAL_TABLET | Freq: Three times a day (TID) | ORAL | Status: DC | PRN

## 2021-12-30 MED ORDER — ACETAMINOPHEN 10 MG/ML IV SOLN: 1000.0000 mg | Freq: Once | INTRAVENOUS | Status: DC | PRN

## 2021-12-30 MED ORDER — DROPERIDOL 2.5 MG/ML IJ SOLN: 0.6250 mg | Freq: Once | INTRAMUSCULAR | Status: DC | PRN

## 2021-12-30 MED ORDER — OXYCODONE HCL 5 MG PO TABS
5.0000 mg | ORAL_TABLET | Freq: Once | ORAL | Status: AC | PRN
  Administered 2021-12-30: 5 mg via ORAL

## 2021-12-30 MED ORDER — HYDROMORPHONE HCL 1 MG/ML IJ SOLN: 0.5000 mg | INTRAMUSCULAR | Status: DC | PRN

## 2021-12-30 MED ORDER — OXYCODONE HCL 5 MG/5ML PO SOLN: 5.0000 mg | Freq: Once | ORAL | Status: AC | PRN

## 2021-12-30 MED ORDER — PROPOFOL 10 MG/ML IV BOLUS
INTRAVENOUS | Status: DC | PRN
  Administered 2021-12-30: 150 mg via INTRAVENOUS

## 2021-12-30 MED ORDER — SODIUM CHLORIDE 0.9 % IV SOLN: INTRAVENOUS | Status: DC

## 2021-12-30 MED ORDER — FENTANYL CITRATE PF 50 MCG/ML IJ SOSY
25.0000 ug | PREFILLED_SYRINGE | INTRAMUSCULAR | Status: DC | PRN
  Administered 2021-12-30 (×4): 50 ug via INTRAVENOUS

## 2021-12-30 MED ORDER — BUPIVACAINE HCL 0.5 % IJ SOLN
INTRAMUSCULAR | Status: DC | PRN
  Administered 2021-12-30: 10 mL

## 2021-12-30 MED ORDER — ONDANSETRON HCL 4 MG PO TABS: 4.0000 mg | ORAL_TABLET | Freq: Four times a day (QID) | ORAL | Status: DC | PRN

## 2021-12-30 MED ORDER — METOCLOPRAMIDE HCL 5 MG/ML IJ SOLN: 5.0000 mg | Freq: Three times a day (TID) | INTRAMUSCULAR | Status: DC | PRN

## 2021-12-30 MED ORDER — LACTATED RINGERS IV SOLN: INTRAVENOUS | Status: DC

## 2021-12-30 MED ORDER — DEXAMETHASONE SODIUM PHOSPHATE 4 MG/ML IJ SOLN
INTRAMUSCULAR | Status: DC | PRN
  Administered 2021-12-30: 4 mg via INTRAVENOUS

## 2021-12-30 MED ORDER — KETOROLAC TROMETHAMINE 30 MG/ML IJ SOLN
INTRAMUSCULAR | Status: DC | PRN
  Administered 2021-12-30: 30 mg via INTRAVENOUS

## 2021-12-30 MED ORDER — HYDROCODONE-ACETAMINOPHEN 7.5-325 MG PO TABS: 1.0000 | ORAL_TABLET | ORAL | 0 refills | Status: DC | PRN

## 2021-12-30 MED ORDER — PROMETHAZINE HCL 25 MG/ML IJ SOLN: 6.2500 mg | INTRAMUSCULAR | Status: DC | PRN

## 2021-12-30 MED ORDER — OXYCODONE HCL 5 MG PO TABS: 5.0000 mg | ORAL_TABLET | ORAL | Status: DC | PRN

## 2021-12-30 MED ORDER — LIDOCAINE HCL (CARDIAC) PF 100 MG/5ML IV SOSY
PREFILLED_SYRINGE | INTRAVENOUS | Status: DC | PRN
  Administered 2021-12-30: 40 mg via INTRATRACHEAL

## 2021-12-30 MED ORDER — CEFAZOLIN SODIUM-DEXTROSE 2-4 GM/100ML-% IV SOLN
2.0000 g | INTRAVENOUS | Status: AC
  Administered 2021-12-30: 2 g via INTRAVENOUS

## 2021-12-30 MED ORDER — MIDAZOLAM HCL 5 MG/5ML IJ SOLN
INTRAMUSCULAR | Status: DC | PRN
  Administered 2021-12-30: 2 mg via INTRAVENOUS

## 2021-12-30 MED ORDER — ACETAMINOPHEN 500 MG PO TABS
1000.0000 mg | ORAL_TABLET | Freq: Once | ORAL | Status: AC
  Administered 2021-12-30: 1000 mg via ORAL

## 2021-12-30 MED ORDER — FENTANYL CITRATE (PF) 100 MCG/2ML IJ SOLN
INTRAMUSCULAR | Status: DC | PRN
  Administered 2021-12-30 (×2): 50 ug via INTRAVENOUS

## 2021-12-30 SURGICAL SUPPLY — 23 items
APL PRP STRL LF DISP 70% ISPRP (MISCELLANEOUS) ×1
BNDG ELASTIC 4X5.8 VLCR STR LF (GAUZE/BANDAGES/DRESSINGS) ×1 IMPLANT
BUR SURG RND 4.0X8 FLTD (BURR) IMPLANT
CHLORAPREP W/TINT 26 (MISCELLANEOUS) ×1 IMPLANT
CUFF TOURN SGL QUICK 18X4 (TOURNIQUET CUFF) IMPLANT
DRAPE FLUOR MINI C-ARM 54X84 (DRAPES) ×1 IMPLANT
ELECT REM PT RETURN 9FT ADLT (ELECTROSURGICAL) ×1
ELECTRODE REM PT RTRN 9FT ADLT (ELECTROSURGICAL) ×1 IMPLANT
GAUZE SPONGE 4X4 12PLY STRL (GAUZE/BANDAGES/DRESSINGS) ×1 IMPLANT
GAUZE XEROFORM 1X8 LF (GAUZE/BANDAGES/DRESSINGS) ×2 IMPLANT
GLOVE SURG SYN 9.0  PF PI (GLOVE) ×1
GLOVE SURG SYN 9.0 PF PI (GLOVE) ×1 IMPLANT
KIT TURNOVER KIT A (KITS) ×1 IMPLANT
NS IRRIG 500ML POUR BTL (IV SOLUTION) ×1 IMPLANT
PACK EXTREMITY ARMC (MISCELLANEOUS) ×1 IMPLANT
PAD CAST 4YDX4 CTTN HI CHSV (CAST SUPPLIES) ×2 IMPLANT
PADDING CAST COTTON 4X4 STRL (CAST SUPPLIES) ×1
PUTTY DBX 1CC (Putty) ×1 IMPLANT
PUTTY DBX 1CC DEPUY (Putty) IMPLANT
SUT ETHILON 4-0 (SUTURE) ×1
SUT ETHILON 4-0 FS2 18XMFL BLK (SUTURE) ×1
SUTURE ETHLN 4-0 FS2 18XMF BLK (SUTURE) ×1 IMPLANT
SYR 3ML LL SCALE MARK (SYRINGE) ×1 IMPLANT

## 2021-12-30 NOTE — Anesthesia Preprocedure Evaluation (Addendum)
Anesthesia Evaluation  Patient identified by MRN, date of birth, ID band Patient awake    Reviewed: Allergy & Precautions, NPO status , Patient's Chart, lab work & pertinent test results  History of Anesthesia Complications Negative for: history of anesthetic complications  Airway Mallampati: II  TM Distance: >3 FB Neck ROM: full    Dental no notable dental hx.    Pulmonary neg shortness of breath, Current Smoker and Patient abstained from smoking.   Pulmonary exam normal        Cardiovascular Exercise Tolerance: Good (-) angina (-) Past MI and (-) DOE negative cardio ROS Normal cardiovascular exam     Neuro/Psych negative neurological ROS  negative psych ROS   GI/Hepatic negative GI ROS, Neg liver ROS,neg GERD  ,,  Endo/Other  negative endocrine ROS    Renal/GU      Musculoskeletal  (+) Arthritis ,    Abdominal Normal abdominal exam  (+)   Peds  Hematology negative hematology ROS (+)   Anesthesia Other Findings Past Surgical History: 01/19/2020: ANTERIOR CERVICAL DECOMP/DISCECTOMY FUSION; N/A     Comment:  Procedure: ANTERIOR CERVICAL DECOMPRESSION/DISCECTOMY               FUSION 1 LEVEL C6-7;  Surgeon: Meade Maw, MD;                Location: [facility] ORS;  Service: Neurosurgery;  Laterality:               N/A;  please schedule as first case of the day No date: APPENDECTOMY No date: cystectectomy     Comment:  back of neck side 04/11/2020: HARDWARE REMOVAL; Left     Comment:  Procedure: HARDWARE REMOVAL;  Surgeon: Hessie Knows,               MD;  Location: [facility] ORS;  Service: Orthopedics;                Laterality: Left; 01/11/2020: ORIF WRIST FRACTURE; Left     Comment:  Procedure: OPEN REDUCTION INTERNAL FIXATION (ORIF) WRIST              FRACTURE;  Surgeon: Hessie Knows, MD;  Location: [facility]               ORS;  Service: Orthopedics;  Laterality: Left; No date: SHOULDER SURGERY; Left     Comment:   compound fracture of collar bone  BMI    Body Mass Index: 21.90 kg/m      Reproductive/Obstetrics negative OB ROS                             Anesthesia Physical Anesthesia Plan  ASA: 2  Anesthesia Plan: General   Post-op Pain Management: Tylenol PO (pre-op) and Toradol IV (intra-op)   Induction: Intravenous  PONV Risk Score and Plan: 2 and Ondansetron, Dexamethasone and Midazolam  Airway Management Planned: LMA  Additional Equipment:   Intra-op Plan:   Post-operative Plan: Extubation in OR  Informed Consent: I have reviewed the patients History and Physical, chart, labs and discussed the procedure including the risks, benefits and alternatives for the proposed anesthesia with the patient or authorized representative who has indicated his/her understanding and acceptance.     Dental Advisory Given  Plan Discussed with: Anesthesiologist, CRNA and Surgeon  Anesthesia Plan Comments: (Patient consented for risks of anesthesia including but not limited to:  - adverse reactions to medications - damage to eyes, teeth,  lips or other oral mucosa - nerve damage due to positioning  - sore throat or hoarseness - Damage to heart, brain, nerves, lungs, other parts of body or loss of life  Patient voiced understanding.)        Anesthesia Quick Evaluation

## 2021-12-30 NOTE — Op Note (Signed)
12/30/2021  12:51 PM  PATIENT:  James Boyer  41 y.o. 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  BLOOD ADMINISTERED:none  DRAINS: none   LOCAL MEDICATIONS USED:  MARCAINE     SPECIMEN:  No Specimen  DISPOSITION OF SPECIMEN:  N/A  COUNTS:  YES  TOURNIQUET:   Total Tourniquet Time Documented: Upper Arm (Left) - 26 minutes Total: Upper Arm (Left) - 26 minutes   IMPLANTS: None  DICTATION: .Dragon Dictation patient was brought to the operating room and after general anesthesia was obtained the left arm was prepped and draped in the usual sterile fashion.  After patient identification and timeout procedures were completed tourniquet was raised and a dorsal incision made going through the prior incision.  The skin and subcutaneous tissue were divided and extensor tendons identified and protected.  The dorsal plate was exposed and mini C-arm was used to help get into the appropriate position.  Retractors were placed the ulnar and radial sides with the nonunion site exposed.  A bur was used to create grooves in the distal radius and carpus on both sides of the plate with a small curette being then utilized on the radial side to pull out some bone graft from the styloid and pack into the troughs created with the bur.  After this was completed additional DBX bone putty was placed on both sides of the plate and the wound was then infiltrated with 10 cc half percent Sensorcaine.  The wound was closed with simple  erupted 4-0 nylon followed by a sterile dressing of Xeroform 4 x 4 web roll and Ace wrap.  PLAN OF CARE: Discharge to home after PACU  PATIENT DISPOSITION:  PACU - hemodynamically stable.

## 2021-12-30 NOTE — Transfer of Care (Signed)
Immediate Anesthesia Transfer of Care Note  Patient: James Boyer  Procedure(s) Performed: Bone grafting, left wrist nonunion (Left: Wrist)  Patient Location: PACU  Anesthesia Type: General  Level of Consciousness: awake, alert  and patient cooperative  Airway and Oxygen Therapy: Patient Spontanous Breathing and Patient connected to supplemental oxygen  Post-op Assessment: Post-op Vital signs reviewed, Patient's Cardiovascular Status Stable, Respiratory Function Stable, Patent Airway and No signs of Nausea or vomiting  Post-op Vital Signs: Reviewed and stable  Complications: No notable events documented.

## 2021-12-30 NOTE — Discharge Instructions (Signed)
Keep arm elevated Pain medicine as directed Call office if you are having problems [phone] Ice to the back of the wrist today and tomorrow may help with pain and swelling Work on finger motion all you can Loosen Ace wrap if your fingers swell

## 2021-12-30 NOTE — Anesthesia Procedure Notes (Signed)
Procedure Name: LMA Insertion Date/Time: 12/30/2021 12:12 PM  Performed by: Londell Moh, CRNAPre-anesthesia Checklist: Patient identified, Emergency Drugs available, Suction available, Timeout performed and Patient being monitored Patient Re-evaluated:Patient Re-evaluated prior to induction Oxygen Delivery Method: Circle system utilized Preoxygenation: Pre-oxygenation with 100% oxygen Induction Type: IV induction LMA: LMA inserted LMA Size: 4.0 Number of attempts: 1 Placement Confirmation: positive ETCO2 and breath sounds checked- equal and bilateral Tube secured with: Tape

## 2021-12-30 NOTE — Anesthesia Postprocedure Evaluation (Signed)
Anesthesia Post Note  Patient: James Boyer  Procedure(s) Performed: Bone grafting, left wrist nonunion (Left: Wrist)  Patient location during evaluation: PACU Anesthesia Type: General Level of consciousness: awake and alert Pain management: pain level controlled Vital Signs Assessment: post-procedure vital signs reviewed and stable Respiratory status: spontaneous breathing, nonlabored ventilation and respiratory function stable Cardiovascular status: blood pressure returned to baseline and stable Postop Assessment: no apparent nausea or vomiting Anesthetic complications: no   No notable events documented.   Last Vitals:  Vitals:   12/30/21 1345 12/30/21 1400  BP: (!) 134/102 (!) 126/99  Pulse: 87 87  Resp: 16 16  Temp:  36.9 C  SpO2: 99% 100%    Last Pain:  Vitals:   12/30/21 1400  TempSrc:   PainSc: [place]

## 2021-12-30 NOTE — H&P (Signed)
Chief Complaint Patient presents with Left Wrist - Follow-up, Pain   History of the Present Illness: James Boyer is a 41 y.o. male here today for bone grafting.  He had a prior left wrist arthrodesis that is not united yet. The plan is to go through the door, prior dorsal incision and bur down through the sclerotic bone and cartilage with artificial bone graft to try to get this to unite. No plans for changing hardware.  The patient states his left wrist is doing the same.  The patient is employed at [employer].  I have reviewed past medical, surgical, social and family history, and allergies as documented in the EMR.  Past Medical History: Past Medical History: Diagnosis Date Allergy Anxiety Arthritis Psoriasis as a child  Past Surgical History: Past Surgical History: Procedure Laterality Date ORIF left wrist Left 01/11/2020 Dr Rudene Christians C6-7 ANTERIOR CERVICAL DISCECTOMY AND FUSION 01/19/2020 Dr. Meade Maw @ [place], [place] wrist fusion Left 04/24/2021 By Dr. Rudene Christians APPENDECTOMY CYSTECTOMY FRACTURE SURGERY Left COLLAR BONE  Past Family History: Family History Problem Relation Age of Onset High blood pressure (Hypertension) Mother Kidney disease Mother Lung cancer Father Dementia Maternal Grandmother Dementia Paternal Grandmother  Medications: Current Outpatient Medications Ordered in Epic Medication Sig Dispense Refill cetirizine (ZYRTEC) 10 MG tablet Take 10 mg by mouth once daily glucosamine sulfate (GLUCOSAMINE ORAL) Take 1 tablet by mouth once daily ibuprofen (MOTRIN) 200 MG tablet Take 200 mg by mouth every 6 (six) hours as needed for Pain multivit-min/folic/vit K/lycop (MEN'S MULTIVITAMIN ORAL) Take 1 tablet by mouth once daily  No current Epic-ordered facility-administered medications on file.  Allergies: No Known Allergies  Body mass index is 21.74 kg/m.  Review of Systems: A comprehensive 14 point ROS was performed, reviewed, and the pertinent  orthopaedic findings are documented in the HPI.  Vitals: 12/19/21 1010 BP: 136/84   General Physical Examination:  General/Constitutional: No apparent distress: well-nourished and well developed. Eyes: Pupils equal, round with synchronous movement. Lungs: Clear to auscultation HEENT: Normal Vascular: No edema, swelling or tenderness, except as noted in detailed exam. Cardiac: Heart rate and rhythm is regular. Integumentary: No impressive skin lesions present, except as noted in detailed exam. Neuro/Psych: Normal mood and affect, oriented to person, place and time.  Musculoskeletal Exam: On exam, good finger flexion and extension. Able to make a fist.  Radiographs:  No new imaging studies were obtained or reviewed today.  Assessment: ICD-10-CM 1. 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  We discussed the patient's prior x-ray findings. I recommend left wrist arthrodesis. I explained the surgery and postoperative course in detail. He was given a work note that we will keep him out for a little over 2 weeks until his sutures are out, then I hope he is able to return to his usual job at [employer]. Risks, benefits, and possible complications were previously discussed.  We will schedule the patient for left wrist arthrodesis in the near future.  Surgical Risks:  The nature of the condition and the proposed procedure has been reviewed in detail with the patient. Surgical versus non-surgical options and prognosis for recovery have been reviewed and the inherent risks and benefits of each have been discussed including the risks of infection, bleeding, injury to nerves/blood vessels/tendons, incomplete relief of symptoms, persisting pain and/or stiffness, loss of function, complex regional pain syndrome, failure of the procedure, as appropriate.  Document Attestation: I, Aliene Altes, have reviewed and  updated documentation for [facility], MD, utilizing Nuance DAX.   Electronically signed by Marlena Clipper, MD at 12/22/2021 8:22 AM EDT   Reviewed  H+P. No changes noted.

## 2022-01-08 ENCOUNTER — Other Ambulatory Visit: Payer: Self-pay | Admitting: Orthopedic Surgery

## 2022-01-08 DIAGNOSIS — M50122 Cervical disc disorder at C5-C6 level with radiculopathy: Secondary | ICD-10-CM

## 2022-01-28 ENCOUNTER — Ambulatory Visit
Admission: RE | Admit: 2022-01-28 | Discharge: 2022-01-28 | Disposition: A | Discharge status: Home / Self Care | Payer: BC Managed Care – PPO | Source: Ambulatory Visit | Attending: Orthopedic Surgery | Admitting: Orthopedic Surgery

## 2022-01-28 DIAGNOSIS — M50122 Cervical disc disorder at C5-C6 level with radiculopathy: Secondary | ICD-10-CM | POA: Diagnosis not present

## 2022-02-26 ENCOUNTER — Ambulatory Visit (INDEPENDENT_AMBULATORY_CARE_PROVIDER_SITE_OTHER): Payer: BC Managed Care – PPO | Admitting: Neurosurgery

## 2022-02-26 ENCOUNTER — Encounter: Payer: Self-pay | Admitting: Neurosurgery

## 2022-02-26 VITALS — BP 120/78 | Ht 69.0 in | Wt 150.0 lb

## 2022-02-26 DIAGNOSIS — M5010 Cervical disc disorder with radiculopathy, unspecified cervical region: Secondary | ICD-10-CM

## 2022-02-26 DIAGNOSIS — Z981 Arthrodesis status: Secondary | ICD-10-CM | POA: Diagnosis not present

## 2022-02-26 MED ORDER — PREGABALIN 25 MG PO CAPS: 25.0000 mg | ORAL_CAPSULE | Freq: Every evening | ORAL | 0 refills | Status: AC
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
## 2022-04-02 ENCOUNTER — Encounter: Payer: Self-pay | Admitting: Neurosurgery

## 2022-04-02 ENCOUNTER — Ambulatory Visit (INDEPENDENT_AMBULATORY_CARE_PROVIDER_SITE_OTHER): Payer: BC Managed Care – PPO | Admitting: Neurosurgery

## 2022-04-02 VITALS — BP 138/97 | Ht 68.0 in | Wt 144.2 lb

## 2022-04-02 DIAGNOSIS — Z981 Arthrodesis status: Secondary | ICD-10-CM

## 2022-04-02 DIAGNOSIS — M5010 Cervical disc disorder with radiculopathy, unspecified cervical region: Secondary | ICD-10-CM | POA: Diagnosis not present

## 2022-04-23 ENCOUNTER — Telehealth: Payer: BC Managed Care – PPO | Admitting: Neurosurgery

## 2022-05-19 ENCOUNTER — Ambulatory Visit (INDEPENDENT_AMBULATORY_CARE_PROVIDER_SITE_OTHER): Payer: BC Managed Care – PPO | Admitting: Neurosurgery

## 2022-05-19 DIAGNOSIS — M47812 Spondylosis without myelopathy or radiculopathy, cervical region: Secondary | ICD-10-CM

## 2022-05-19 NOTE — Progress Notes (Signed)
Neurosurgery Telephone (Audio-Only) Note  Requesting Provider     Sofie Hartigan, MD [place],  [place] [address] T: [phone] F: [phone]  Primary Care Provider Sofie Hartigan, [place] [address] T: [phone] F: [phone]  Telehealth visit was conducted with James Boyer, a 43 y.o. male via telephone.   History of Present Illness: James Boyer is a 42 y.o presenting today via telephone visit to review his response to PT. he states that he has since discharged from physical therapy as his pain has completely resolved.  He has returned to work and is no longer having any radiating arm pain.  He denies any additional concerns today.  04/02/22  James Boyer is a 42 y.o presenting today for follow up. He has started PT and has undergone 2 sessions thus far. He reports overall improve of his radiating arm pain since starting PT and returning to work. He denies any new symptoms today   02/26/22 [name] is a 42 y.o with a history of multiple left wrist operations, daily smoker, and previous ACDF who is here today with a chief complaint of left-sided radiating arm pain.  He states this started in November after his most recent surgery and has persisted since then.  He states he tolerates things pretty well during the day however at night he has sharp radiating pain that starts in his left scapula and radiates down to about his elbow.  He states he has a hard time discerning symptoms in his distal left arm as he cannot tell what is residual from his wrist surgeries and what is related to his neck.  He denies any similar right-sided symptoms.  He states this is similar to the pain he had prior to his previous neck surgery however it is less severe and less persistent.  He endorses weakness in his left arm which has been present for over a year.  He is currently taking Norco.   Conservative measures:  Physical therapy: None recently    Multimodal medical therapy including regular antiinflammatories: none. Currently taking Norco prescribed by Dr. Rudene Christians  Injections: no epidural steroid injections   Past Surgery: C6-7 ACDF    James Boyer has no symptoms of cervical myelopathy.   The symptoms are causing a significant impact on the patient's life.   General Review of Systems:  A ROS was performed including pertinent positive and negatives as documented.  All other systems are negative.   Prior to Admission medications   Medication Sig Start Date End Date Taking? Authorizing Provider  Multiple Vitamin (MULTIVITAMIN) tablet Take 1 tablet by mouth daily.    [provider]  Nutritional Supplements (VITAMIN D BOOSTER PO) Take 1 tablet by mouth daily.    [provider]  pregabalin (LYRICA) 25 MG capsule Take 1 capsule (25 mg total) by mouth at bedtime. 02/26/22   Loleta Dicker, PA    DATA REVIEWED    Imaging Studies  No interval imaging to review  IMPRESSION  James Boyer is a 42 y.o. male who I performed a telephone encounter today for evaluation and management of: Cervical radiculopathy  PLAN  James Boyer is a pleasant 42 y.o with history of cervical radiculopathy.  Today he reports complete resolution of his neck and arm pain.  He has done well returning to work.  We discussed further plan of care which would be to resume his activities as tolerated.  We will provide him with  a return to work note without restrictions.  He was instructed to call our office should his symptoms recur and we will discuss further treatment plan at that point.  We will otherwise see him going forward on an as-needed basis.   He expressed understanding and was in agreement with this plan.  No orders of the defined types were placed in this encounter.    DISPOSITION  Follow up: In person appointment in  PRN  Loleta Dicker, PA   TELEPHONE DOCUMENTATION   This visit was performed via telephone.  Patient  location: home Provider location: office  I spent a total of 5 minutes non-face-to-face activities for this visit on the date of this encounter including review of current clinical condition and response to treatment.  The patient is aware of and accepts the limits of this telehealth visit.

## 2023-10-16 ENCOUNTER — Encounter: Payer: Self-pay | Admitting: Emergency Medicine

## 2023-10-16 ENCOUNTER — Ambulatory Visit: Attending: Emergency Medicine | Admitting: Emergency Medicine

## 2023-10-16 ENCOUNTER — Other Ambulatory Visit: Payer: Self-pay

## 2023-10-16 VITALS — BP 151/101 | HR 119 | Temp 97.3°F | Resp 20

## 2023-10-16 DIAGNOSIS — G510 Bell's palsy: Secondary | ICD-10-CM | POA: Insufficient documentation

## 2023-10-16 MED ORDER — PREDNISONE 20 MG PO TABS *I*
40.0000 mg | ORAL_TABLET | Freq: Two times a day (BID) | ORAL | 0 refills | Status: AC
Start: 2023-10-16 — End: 2023-10-23

## 2023-10-16 MED ORDER — REFRESH P.M. OP OINT *A*
TOPICAL_OINTMENT | OPHTHALMIC | 0 refills | Status: DC
Start: 2023-10-16 — End: 2023-10-28

## 2023-10-16 MED ORDER — VALACYCLOVIR HCL 1000 MG PO TABS *I*
1000.0000 mg | ORAL_TABLET | Freq: Three times a day (TID) | ORAL | 0 refills | Status: AC
Start: 2023-10-16 — End: 2023-10-23

## 2023-10-16 NOTE — [facility] Provider Note (Signed)
 History Chief Complaint Patient presents with . Numbness   Woke up yesterday, slept on right arm, reports it was numb - resolved within 30 minutes. Then noticed right side of upper lip tingling, throughout day yesterday the tingling/numbness started spreading to right cheek.Slightly better today but feels like I got a shot of novocain.Reports right ear feeling full.Tried heating pad, positive effect. Denies difficulty breathing or swallowing.  Patient is a 43 year old male with history of Cervical degenerative disc disease who is here today for evaluation of some facial numbness and facial droop. He says yesterday woke up with some numbness to his right arm that lasted about 30 minutes but attributed that to sleeping on it,That completely resolved but later started feeling some tingling in upper lip and cheek, This morning when woke up had a facial droop, unable to smile on right side and fully close his right eye, he denies any numbness, weakness in arms or legs today No problems with speaking or swallowing. Patient denies any skin rashes, no known tick bitesMedical/Surgical/Family History Past Medical History[1] Patient Active Problem List Diagnosis Code . S/P appy Z90.49 . Impingement syndrome of shoulder region, right M75.41 . Anxiety F41.9 . Essential hypertension I10 . s/p posterior neck mass excision 03/03/2019 R22.2 . Postoperative visit Z48.89  Past Surgical History[2]Family History[3] Social History[4]Living Situation   Questions Responses  Patient lives with   Homeless   Caregiver for other family member   External Services   Employment   Domestic Violence Risk     Review of Systems Review of Systems Constitutional:  Negative for chills and fever. HENT:  Negative for trouble swallowing.  Neurological:  Positive for facial asymmetry and numbness. Negative for speech difficulty and weakness.  Physical Exam Vitals   First Recorded BP: (!) 151/101, Resp: 20, Temp: 36.3 C (97.3 F), Temp src: TEMPORAL Oxygen Therapy SpO2: 98 %, Heart Rate: (!) 119, (10/16/23 0909)  .Physical ExamVitals and nursing note reviewed. Constitutional:     Appearance: Normal appearance. HENT:    Mouth/Throat:    Mouth: Mucous membranes are moist.    Pharynx: Oropharynx is clear. Cardiovascular:    Rate and Rhythm: Normal rate.    Pulses: Normal pulses. Pulmonary:    Effort: Pulmonary effort is normal. Neurological:    Mental Status: He is alert.    Cranial Nerves: Cranial nerve deficit present.    Sensory: No sensory deficit.    Motor: No weakness.    Coordination: Coordination normal.    Gait: Gait normal.    Deep Tendon Reflexes: Reflexes normal.    Comments: Right sided facial nerve paralysis  Medical Decision Making Medical Decision MakingAssessment:  Patient is a 43 year old male here today for evaluation of onset some numbness to face and facial drooping that started  yesterday. Differential diagnosis:  Bells palsyLyme diseaseCVAHerpes zosterPlan and Results:  Will start on high dose prednisone  and Valtrex  for Bells Palsy. Will screen for Lyme. Patient to follow up with PCP next weekEncounter ordersOrders Placed This Encounter    Lyme AB Screen    predniSONE  (DELTASONE ) 20 mg tablet    valACYclovir  (VALTREX ) 1 gm tablet    artificial tears (WHITE PETROLATUM) ointmentFinal Diagnosis  ICD-10-CM ICD-9-CM 1. Bell's palsy  G51.0 351.0 [place] Pedigo Pauroso Scarlettrose Costilow, MDAuthor:  Earnie Loise Mosses, MD [1]Past Medical History:Diagnosis Date . Scapular dyskinesis 11/26/2015 . Tendonitis of upper biceps tendon of right shoulder 11/26/2015 [2]Past Surgical History:Procedure Laterality Date . PR EXC B9 LESION MRGN XCP SK TG T/A/L 2.1-3.0 CM  N/A 03/03/2019  Procedure: EXCISION, MASS, BACK=9;   Surgeon: Geofm Alm BRAVO, MD;  Location: [facility];  Service: General [3]Family HistoryProblem Relation Name Age of Onset . Conversion Other        20100511^Adenocarcinoma Of The Lung^162.9^Active^ . Conversion Other        79899488^Ipjazuzd Mellitus^250.00^Active^ [4]Social HistoryTobacco Use . Smoking status: Every Day   Packs/day: .5   Types: Cigarettes . Smokeless tobacco: Never

## 2023-10-16 NOTE — Patient Instructions (Addendum)
 You have something known as Bells Palsy. We will check you for Lyme disease. I have started you on a steroid and antiviral medication . It is important to keep your eye moisturized, You should use liquid tears every hour while awake and Lacrilube eye ointment at bedtime and you should use some paper tape to tape the eyelid closed when you sleep to avoid injury to the Cornea.I want you to follow up with your PCP next week

## 2023-10-18 ENCOUNTER — Encounter: Payer: Self-pay | Admitting: Primary Care

## 2023-10-18 LAB — LYME AB SCREEN: Lyme AB Screen: NEGATIVE

## 2023-10-26 ENCOUNTER — Encounter: Payer: Self-pay | Admitting: Primary Care

## 2023-10-26 ENCOUNTER — Telehealth: Payer: Self-pay | Admitting: Primary Care

## 2023-10-26 NOTE — Telephone Encounter (Signed)
 Called patient and left message to give the office a call back to schedule appointment with Dr.El Brazil for Wednesday morning.

## 2023-10-28 ENCOUNTER — Other Ambulatory Visit: Payer: Self-pay

## 2023-10-28 ENCOUNTER — Ambulatory Visit: Admitting: Primary Care

## 2023-10-28 VITALS — BP 120/80 | HR 108 | Wt 147.0 lb

## 2023-10-28 DIAGNOSIS — H9201 Otalgia, right ear: Secondary | ICD-10-CM

## 2023-10-28 DIAGNOSIS — G510 Bell's palsy: Secondary | ICD-10-CM

## 2023-10-28 DIAGNOSIS — H6121 Impacted cerumen, right ear: Secondary | ICD-10-CM

## 2023-11-01 NOTE — Progress Notes (Signed)
 Subjective: Patient ID: Carlos Dixon is a 43 y.o. male.Follow-up Bell's palsy, continue to have pain in right ear, hearing decreased, weakness on right side of face, eye doing okay, no headaches, no fevers, no rash, Lyme negativeAdam has S/P appy; Impingement syndrome of shoulder region, right; Essential hypertension; s/p posterior neck mass excision 03/03/2019; and Cervical radiculitis on their problem list.Carlos Dixon has a current medication list which includes the following prescription(s): multiple vitamins-minerals, cholecalciferol, glucosamine chondroitin, and loratadine.Review of Systems  Objective: Physical ExamVitals reviewed. Constitutional:     Appearance: Normal appearance. HENT:    Right Ear: Ear canal normal. There is impacted cerumen. Cardiovascular:    Rate and Rhythm: Normal rate. Pulmonary:    Effort: Pulmonary effort is normal. Musculoskeletal:    Right lower leg: No edema.    Left lower leg: No edema. Skin:   Findings: No rash. 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 The ear was then irrigated with warm water  to remove the remaining wax.POST- PROCEDURE EXAM: TMs successfully visualized and found to be intact with no redness or perforation. 98% of ear wax had been removed from the ear canals.POST-PROCEDURE INSTRUCTIONS: avoid use of Q-tips.   Assessment:  1.  Bell's palsy: Treated with steroids and valacyclovir , minimal improvement, Lyme negative, should slowly improve on its own, further neurologic workup if persists2.  Ear  pain/hearing loss: Has impacted cerumen, removed without difficulty with improvement in symptoms3.  History of hypertension: BP okay, follow4.  Cholesterol: Recheck fasting profile5.  Tobacco: Again discussed quitting Plan:  Follow-up for health maintenance exam

## 2023-11-02 ENCOUNTER — Ambulatory Visit: Admitting: Primary Care

## 2023-12-03 ENCOUNTER — Encounter: Payer: Self-pay | Admitting: Internal Medicine

## 2024-04-03 ENCOUNTER — Encounter: Admitting: Primary Care

## 7863-04-24 DEATH — deceased
